# Patient Record
Sex: Female | Born: 1935 | Race: White | Hispanic: No | Smoking: Never smoker
Health system: Southern US, Community
[De-identification: ages and names within clinical notes are randomized; demographics above are authoritative.]

## PROBLEM LIST (undated history)

## (undated) DIAGNOSIS — E785 Hyperlipidemia, unspecified: Secondary | ICD-10-CM

## (undated) DIAGNOSIS — M1990 Unspecified osteoarthritis, unspecified site: Secondary | ICD-10-CM

## (undated) DIAGNOSIS — T7840XA Allergy, unspecified, initial encounter: Secondary | ICD-10-CM

## (undated) DIAGNOSIS — R32 Unspecified urinary incontinence: Secondary | ICD-10-CM

## (undated) DIAGNOSIS — I10 Essential (primary) hypertension: Secondary | ICD-10-CM

## (undated) DIAGNOSIS — C4491 Basal cell carcinoma of skin, unspecified: Secondary | ICD-10-CM

## (undated) DIAGNOSIS — M810 Age-related osteoporosis without current pathological fracture: Secondary | ICD-10-CM

## (undated) HISTORY — PX: BASAL CELL CARCINOMA EXCISION: SHX1214

## (undated) HISTORY — DX: Hyperlipidemia, unspecified: E78.5

## (undated) HISTORY — DX: Unspecified osteoarthritis, unspecified site: M19.90

## (undated) HISTORY — PX: CHOLECYSTECTOMY: SHX55

## (undated) HISTORY — DX: Allergy, unspecified, initial encounter: T78.40XA

## (undated) HISTORY — DX: Age-related osteoporosis without current pathological fracture: M81.0

## (undated) HISTORY — DX: Basal cell carcinoma of skin, unspecified: C44.91

## (undated) HISTORY — PX: OVARIAN CYST SURGERY: SHX726

## (undated) HISTORY — DX: Unspecified urinary incontinence: R32

---

## 1975-01-26 HISTORY — PX: BREAST SURGERY: SHX581

## 1999-01-26 LAB — HM COLONOSCOPY

## 2000-07-12 ENCOUNTER — Emergency Department (HOSPITAL_COMMUNITY): Admission: EM | Admit: 2000-07-12 | Discharge: 2000-07-12 | Payer: Self-pay | Admitting: Emergency Medicine

## 2000-10-12 ENCOUNTER — Other Ambulatory Visit: Admission: RE | Admit: 2000-10-12 | Discharge: 2000-10-12 | Payer: Self-pay | Admitting: Internal Medicine

## 2000-11-17 ENCOUNTER — Emergency Department (HOSPITAL_COMMUNITY): Admission: EM | Admit: 2000-11-17 | Discharge: 2000-11-17 | Payer: Self-pay | Admitting: Emergency Medicine

## 2000-11-17 ENCOUNTER — Encounter: Payer: Self-pay | Admitting: Emergency Medicine

## 2001-08-09 ENCOUNTER — Ambulatory Visit (HOSPITAL_COMMUNITY): Admission: RE | Admit: 2001-08-09 | Discharge: 2001-08-09 | Payer: Self-pay | Admitting: Gastroenterology

## 2001-12-07 ENCOUNTER — Encounter: Payer: Self-pay | Admitting: *Deleted

## 2001-12-07 ENCOUNTER — Emergency Department (HOSPITAL_COMMUNITY): Admission: EM | Admit: 2001-12-07 | Discharge: 2001-12-07 | Payer: Self-pay | Admitting: *Deleted

## 2002-11-15 ENCOUNTER — Encounter: Admission: RE | Admit: 2002-11-15 | Discharge: 2002-11-15 | Payer: Self-pay | Admitting: Internal Medicine

## 2002-11-15 ENCOUNTER — Encounter: Payer: Self-pay | Admitting: Internal Medicine

## 2003-10-31 ENCOUNTER — Other Ambulatory Visit: Admission: RE | Admit: 2003-10-31 | Discharge: 2003-10-31 | Payer: Self-pay | Admitting: Internal Medicine

## 2010-09-16 ENCOUNTER — Encounter (INDEPENDENT_AMBULATORY_CARE_PROVIDER_SITE_OTHER): Payer: Medicare Other | Admitting: Ophthalmology

## 2010-09-16 DIAGNOSIS — H3530 Unspecified macular degeneration: Secondary | ICD-10-CM

## 2010-09-16 DIAGNOSIS — H33309 Unspecified retinal break, unspecified eye: Secondary | ICD-10-CM

## 2010-09-16 DIAGNOSIS — H43819 Vitreous degeneration, unspecified eye: Secondary | ICD-10-CM

## 2010-09-30 ENCOUNTER — Encounter (INDEPENDENT_AMBULATORY_CARE_PROVIDER_SITE_OTHER): Payer: Medicare Other | Admitting: Ophthalmology

## 2010-09-30 DIAGNOSIS — H33309 Unspecified retinal break, unspecified eye: Secondary | ICD-10-CM

## 2010-10-09 ENCOUNTER — Encounter (INDEPENDENT_AMBULATORY_CARE_PROVIDER_SITE_OTHER): Payer: Medicare Other | Admitting: Ophthalmology

## 2010-10-09 DIAGNOSIS — H33309 Unspecified retinal break, unspecified eye: Secondary | ICD-10-CM

## 2010-12-09 ENCOUNTER — Other Ambulatory Visit: Payer: Self-pay

## 2010-12-09 ENCOUNTER — Encounter: Payer: Self-pay | Admitting: *Deleted

## 2010-12-09 ENCOUNTER — Emergency Department (HOSPITAL_COMMUNITY)
Admission: EM | Admit: 2010-12-09 | Discharge: 2010-12-09 | Disposition: A | Discharge status: Home / Self Care | Payer: Medicare Other | Attending: Emergency Medicine | Admitting: Emergency Medicine

## 2010-12-09 DIAGNOSIS — R42 Dizziness and giddiness: Secondary | ICD-10-CM | POA: Insufficient documentation

## 2010-12-09 DIAGNOSIS — R55 Syncope and collapse: Secondary | ICD-10-CM | POA: Insufficient documentation

## 2010-12-09 DIAGNOSIS — I10 Essential (primary) hypertension: Secondary | ICD-10-CM | POA: Insufficient documentation

## 2010-12-09 DIAGNOSIS — Z79899 Other long term (current) drug therapy: Secondary | ICD-10-CM | POA: Insufficient documentation

## 2010-12-09 HISTORY — DX: Essential (primary) hypertension: I10

## 2010-12-09 NOTE — ED Notes (Signed)
CBG 166 

## 2010-12-09 NOTE — ED Provider Notes (Addendum)
History     CSN: [id number] Arrival date & time: 12/09/2010 10:11 AM   First MD Initiated Contact with Patient 12/09/10 1027      Chief Complaint  Patient presents with  . Near Syncope    (Consider location/radiation/quality/duration/timing/severity/associated sxs/prior treatment) HPI This is a 77 year old pleasant lady with PMH of hypertension and a questionable hypothyroidism who presents to the ED with fainting episode. The history is provided by patient and her daughter. Patient states that she went to her orthopedic surgeons office today for right knee arthritis injection. She only drinks half a cup of juice for breakfast and took her atenolol 25 mg dose before the office visit. She started to feel fainting at 930am while waiting in the office. She was resting on the chair at the time. Denies vertigo, headache or seizure activities. No loss of consciousness. No weakness, numbness or tingling. No chest pain, chest pressure or palpitation. No nausea, vomiting or abdominal pain. Patient felt better and completely relieved after she was given some crackers and coke immediately. Patient was brought by EMS to [facility] ED for full evaluation.  Of note, pt takes atenolol 25 mg po QOD at night time. She took one dose this am since she missed her dose last night. Past Medical History  Diagnosis Date  . Hypertension     History reviewed. No pertinent past surgical history.  History reviewed. No pertinent family history.  History  Substance Use Topics  . Smoking status: Never Smoker   . Smokeless tobacco: Not on file  . Alcohol Use: No    OB History    Grav Para Term Preterm Abortions TAB SAB Ect Mult Living                  Review of Systems See HPI  Allergies  Penicillins; Sulfur; and Codeine  Home Medications   Current Outpatient Rx  Name Route Sig Dispense Refill  . ATENOLOL 25 MG PO TABS Oral Take 25 mg by mouth every other day.       BP 146/72  Pulse 57  Temp(Src)  96.8 F (36 C) (Oral)  Resp 18  SpO2 96%  Physical Exam General: alert, well-developed, and cooperative to examination.  Head: normocephalic and atraumatic.  Eyes: vision grossly intact, pupils equal, pupils round, pupils reactive to light, no injection and anicteric.  Mouth: pharynx pink and moist, no erythema, and no exudates.  Neck: supple, full ROM, no thyromegaly, no JVD, and no carotid bruits.  Lungs: normal respiratory effort, no accessory muscle use, normal breath sounds, no crackles, and no wheezes. Heart: normal rate, regular rhythm, no murmur, no gallop, and no rub.  Abdomen: soft, non-tender, normal bowel sounds, no distention, no guarding, no rebound tenderness, no hepatomegaly, and no splenomegaly.  Msk: no joint swelling, no joint warmth, and no redness over joints.  Pulses: 2+ DP/PT pulses bilaterally Extremities: No cyanosis, clubbing, edema Neurologic: alert & oriented X3, cranial nerves II-XII intact, strength normal in all extremities, sensation intact to light touch, and gait normal.  Skin: turgor normal and no rashes.  Psych: Oriented X3, memory intact for recent and remote, normally interactive, good eye contact, not anxious appearing, and not depressed appearing.   ED Course  Procedures (including critical care time)  1. lightheadness The clinical manifestation is consistent with lightheadness. The DDX include hypoglycemia, cardiac source and cerebral source. Pt did not eat her breakfast except for half cup of OJ this am. And her symptoms were relieved by food. Given  the fact that she did not have any cardiac symptoms or any sign of acute CVA/TIA, The likely etiology is hypoglycemia. Pt CBG and BP WNL now. - will perform EKG now. - will instruct her to follow up with her PCP in one week. - will D/C her home today if EKG is normal.      Dede Query, MD 12/09/10 1108  Dede Query, MD 12/09/10 1128

## 2010-12-09 NOTE — ED Provider Notes (Signed)
  I performed a history and physical examination of Summer Rodriguez and discussed her management with Dr. Dierdre Searles.  I agree with the history, physical, assessment, and plan of care, with the following exceptions: see below  Pt is asymptomatic, history is likely that her symptoms are related to taking her BP today rather than last night and had virtually empty stomach on top of that.  She ate some and felt improved.  No complete syncope, palpitations, CP, SOB, back pain.  She has no abd tenderness.    ECG shows sinus bradycardia, normal axis, intervals and no ST changes.   I was present for the following procedures: None Time Spent in Critical Care of the patient: None Time spent in discussions with the patient and family: 10 min  Darwin Rothlisberger Y.    Gavin Pound. Oletta Lamas, MD 12/09/10 1718

## 2010-12-09 NOTE — ED Notes (Signed)
Onset today at [facility] office for right knee possible injection.  Patient takes atenolol every other day at night and did not take medication last night.  Patient took medication prior to going to the Doctor's office.  While at [facility] office patient talking with Doctor and daughter at bedside patient felt like she was going to pass out.  Patient currently feels back to normal. Patient ax4 answering and following commands appropriate.  Airway intact bilateral equal chest rise and fall.

## 2010-12-09 NOTE — ED Notes (Signed)
Patient states she was at the orthopedic office about her right knee and states she took her atenolol this am and didn't eat of which she normally takes every other night  States she started getting dizzy and felt like she was going to faint. Presently states she feel her normal self. Daughter at bedside.

## 2010-12-10 LAB — GLUCOSE, CAPILLARY: Glucose-Capillary: 166 mg/dL — ABNORMAL HIGH (ref 70–99)

## 2011-02-08 ENCOUNTER — Ambulatory Visit (INDEPENDENT_AMBULATORY_CARE_PROVIDER_SITE_OTHER): Payer: Medicare Other | Admitting: Ophthalmology

## 2011-04-14 ENCOUNTER — Ambulatory Visit (INDEPENDENT_AMBULATORY_CARE_PROVIDER_SITE_OTHER): Payer: Medicare Other | Admitting: Ophthalmology

## 2011-04-16 ENCOUNTER — Ambulatory Visit (INDEPENDENT_AMBULATORY_CARE_PROVIDER_SITE_OTHER): Payer: Medicare Other | Admitting: Ophthalmology

## 2011-04-20 ENCOUNTER — Ambulatory Visit (INDEPENDENT_AMBULATORY_CARE_PROVIDER_SITE_OTHER): Payer: Medicare Other | Admitting: Ophthalmology

## 2011-04-20 DIAGNOSIS — H33309 Unspecified retinal break, unspecified eye: Secondary | ICD-10-CM

## 2011-04-20 DIAGNOSIS — D3130 Benign neoplasm of unspecified choroid: Secondary | ICD-10-CM

## 2011-04-20 DIAGNOSIS — H43819 Vitreous degeneration, unspecified eye: Secondary | ICD-10-CM | POA: Diagnosis not present

## 2011-04-20 DIAGNOSIS — H3530 Unspecified macular degeneration: Secondary | ICD-10-CM

## 2011-04-27 DIAGNOSIS — D3130 Benign neoplasm of unspecified choroid: Secondary | ICD-10-CM | POA: Diagnosis not present

## 2011-04-28 DIAGNOSIS — I10 Essential (primary) hypertension: Secondary | ICD-10-CM | POA: Diagnosis not present

## 2011-04-28 DIAGNOSIS — E782 Mixed hyperlipidemia: Secondary | ICD-10-CM | POA: Diagnosis not present

## 2011-04-28 DIAGNOSIS — E213 Hyperparathyroidism, unspecified: Secondary | ICD-10-CM | POA: Diagnosis not present

## 2011-04-28 DIAGNOSIS — Z0000 Encounter for general adult medical examination without abnormal findings: Secondary | ICD-10-CM | POA: Diagnosis not present

## 2011-04-28 DIAGNOSIS — E559 Vitamin D deficiency, unspecified: Secondary | ICD-10-CM | POA: Diagnosis not present

## 2011-05-04 DIAGNOSIS — M899 Disorder of bone, unspecified: Secondary | ICD-10-CM | POA: Diagnosis not present

## 2011-05-04 DIAGNOSIS — Z1231 Encounter for screening mammogram for malignant neoplasm of breast: Secondary | ICD-10-CM | POA: Diagnosis not present

## 2011-05-04 DIAGNOSIS — M949 Disorder of cartilage, unspecified: Secondary | ICD-10-CM | POA: Diagnosis not present

## 2011-05-11 DIAGNOSIS — Z1211 Encounter for screening for malignant neoplasm of colon: Secondary | ICD-10-CM | POA: Diagnosis not present

## 2011-11-23 DIAGNOSIS — L608 Other nail disorders: Secondary | ICD-10-CM | POA: Diagnosis not present

## 2011-11-23 DIAGNOSIS — I739 Peripheral vascular disease, unspecified: Secondary | ICD-10-CM | POA: Diagnosis not present

## 2011-11-23 DIAGNOSIS — B351 Tinea unguium: Secondary | ICD-10-CM | POA: Diagnosis not present

## 2011-11-23 DIAGNOSIS — M25579 Pain in unspecified ankle and joints of unspecified foot: Secondary | ICD-10-CM | POA: Diagnosis not present

## 2011-11-23 DIAGNOSIS — M79609 Pain in unspecified limb: Secondary | ICD-10-CM | POA: Diagnosis not present

## 2012-04-17 DIAGNOSIS — I739 Peripheral vascular disease, unspecified: Secondary | ICD-10-CM | POA: Diagnosis not present

## 2012-04-17 DIAGNOSIS — L608 Other nail disorders: Secondary | ICD-10-CM | POA: Diagnosis not present

## 2012-04-19 ENCOUNTER — Ambulatory Visit (INDEPENDENT_AMBULATORY_CARE_PROVIDER_SITE_OTHER): Payer: Medicare Other | Admitting: Ophthalmology

## 2012-04-24 ENCOUNTER — Ambulatory Visit (INDEPENDENT_AMBULATORY_CARE_PROVIDER_SITE_OTHER): Payer: Medicare Other | Admitting: Ophthalmology

## 2012-04-24 DIAGNOSIS — D3130 Benign neoplasm of unspecified choroid: Secondary | ICD-10-CM

## 2012-04-24 DIAGNOSIS — I10 Essential (primary) hypertension: Secondary | ICD-10-CM

## 2012-04-24 DIAGNOSIS — H43819 Vitreous degeneration, unspecified eye: Secondary | ICD-10-CM

## 2012-04-24 DIAGNOSIS — H3530 Unspecified macular degeneration: Secondary | ICD-10-CM | POA: Diagnosis not present

## 2012-04-24 DIAGNOSIS — H3560 Retinal hemorrhage, unspecified eye: Secondary | ICD-10-CM | POA: Diagnosis not present

## 2012-04-24 DIAGNOSIS — H35039 Hypertensive retinopathy, unspecified eye: Secondary | ICD-10-CM | POA: Diagnosis not present

## 2012-04-24 DIAGNOSIS — H33309 Unspecified retinal break, unspecified eye: Secondary | ICD-10-CM

## 2012-05-08 DIAGNOSIS — Z1231 Encounter for screening mammogram for malignant neoplasm of breast: Secondary | ICD-10-CM | POA: Diagnosis not present

## 2012-05-30 ENCOUNTER — Ambulatory Visit (INDEPENDENT_AMBULATORY_CARE_PROVIDER_SITE_OTHER): Payer: Medicare Other | Admitting: Gynecology

## 2012-05-30 ENCOUNTER — Encounter: Payer: Self-pay | Admitting: Gynecology

## 2012-05-30 ENCOUNTER — Other Ambulatory Visit (HOSPITAL_COMMUNITY)
Admission: RE | Admit: 2012-05-30 | Discharge: 2012-05-30 | Disposition: A | Discharge status: Home / Self Care | Payer: Medicare Other | Source: Ambulatory Visit | Attending: Gynecology | Admitting: Gynecology

## 2012-05-30 VITALS — BP 130/80 | Ht 66.0 in | Wt 150.0 lb

## 2012-05-30 DIAGNOSIS — H3560 Retinal hemorrhage, unspecified eye: Secondary | ICD-10-CM | POA: Diagnosis not present

## 2012-05-30 DIAGNOSIS — Z124 Encounter for screening for malignant neoplasm of cervix: Secondary | ICD-10-CM | POA: Insufficient documentation

## 2012-05-30 DIAGNOSIS — M899 Disorder of bone, unspecified: Secondary | ICD-10-CM | POA: Diagnosis not present

## 2012-05-30 DIAGNOSIS — R32 Unspecified urinary incontinence: Secondary | ICD-10-CM | POA: Diagnosis not present

## 2012-05-30 DIAGNOSIS — M8580 Other specified disorders of bone density and structure, unspecified site: Secondary | ICD-10-CM

## 2012-05-30 DIAGNOSIS — M949 Disorder of cartilage, unspecified: Secondary | ICD-10-CM

## 2012-05-30 DIAGNOSIS — N952 Postmenopausal atrophic vaginitis: Secondary | ICD-10-CM | POA: Diagnosis not present

## 2012-05-30 NOTE — Progress Notes (Signed)
Summer Rodriguez 11/09/1921 [id number]        79 y.o.  [id number] new patient with several issues noted below.   Past medical history,surgical history, medications, allergies, family history and social history were all reviewed and documented in the EPIC chart. ROS:  Was performed and pertinent positives and negatives are included in the history.  Exam: Kim assistant Filed Vitals:   05/30/12 1610  BP: 130/80  Height: 5\' 6"  (1.676 m)  Weight: 150 lb (68.04 kg)   General appearance  Normal Skin grossly normal Head/Neck normal with no cervical or supraclavicular adenopathy thyroid normal Lungs  clear Cardiac RR, without RMG Abdominal  soft, nontender, without masses, organomegaly or hernia Breasts  examined lying and sitting without masses, retractions, discharge or axillary adenopathy. Bilateral implants noted. Pelvic  Ext/BUS/vagina  normal age appropriate with atrophic changes  Cervix  normal flush with the upper vagina  Uterus  difficult to palpate but grossly normal   Adnexa  Without masses or tenderness    Anus and perineum  normal   Rectovaginal  normal sphincter tone without palpated masses or tenderness.    Assessment/Plan:  79 y.o. [id number] female    1. Atrophic genital changes. Patient asymptomatic and we'll continue to monitor. 2. Urinary incontinence. Patient has some leakage of urine difficult to separate stress from urge. Not overly bothersome to the patient who reports this as an occasional episode. We'll plan expectant management at this time. To consider more thorough evaluation and/or consideration for OAB medication if symptoms worsen. 3. Constipation/lower rectal discomfort. Had asked for GI referral from her primary and colonoscopy and apparently was told she exceeded the age limit. Given her symptoms I think she should be evaluated and will refer to GI. Whether they perform colonoscopy or not will let them decide. 4. Osteopenia. Had DEXA 2013 historically at  [place]. Do not have a copy of this and will retrieve it. Was told that it was stable in no treatment needed. 5. Mammography 2014 at [place]. Will retrieve copy of this. Does have partial bilateral mastectomies for fibrocystic disease years ago with implants. We'll continue to follow with [facility]. 6. Pap smear 12 years ago. Patient very concerned about lack of Pap smear followup. I reviewed the current screening guidelines and ultimately performed a Pap smear for her reassurance. 7. Health maintenance. No blood work done as this will be done through her primary physician's office. Followup in one year, sooner as needed.    Dara Lords MD, 5:06 PM 05/30/2012

## 2012-05-30 NOTE — Patient Instructions (Signed)
Office will call you to arrange GI consult.

## 2012-05-31 ENCOUNTER — Telehealth: Payer: Self-pay | Admitting: *Deleted

## 2012-05-31 ENCOUNTER — Encounter: Payer: Self-pay | Admitting: Gastroenterology

## 2012-05-31 DIAGNOSIS — K6289 Other specified diseases of anus and rectum: Secondary | ICD-10-CM

## 2012-05-31 NOTE — Telephone Encounter (Signed)
Pt informed with the below note. 

## 2012-05-31 NOTE — Telephone Encounter (Signed)
Message copied by Aura Camps on Wed May 31, 2012  9:16 AM ------      Message from: Dara Lords      Created: Tue May 30, 2012  5:14 PM       Patient needs GI referral reference lower abdominal discomfort and rectal discomfort. ------

## 2012-05-31 NOTE — Telephone Encounter (Signed)
Referral order placed appt. On 06/22/12 @ 3:00pm pt asked me to give information to her daughter Luster Landsberg at [phone] I left message for pt to call.

## 2012-06-02 ENCOUNTER — Encounter (INDEPENDENT_AMBULATORY_CARE_PROVIDER_SITE_OTHER): Payer: Medicare Other | Admitting: Ophthalmology

## 2012-06-02 DIAGNOSIS — H3560 Retinal hemorrhage, unspecified eye: Secondary | ICD-10-CM

## 2012-06-02 DIAGNOSIS — D3130 Benign neoplasm of unspecified choroid: Secondary | ICD-10-CM | POA: Diagnosis not present

## 2012-06-02 DIAGNOSIS — I10 Essential (primary) hypertension: Secondary | ICD-10-CM

## 2012-06-02 DIAGNOSIS — H3530 Unspecified macular degeneration: Secondary | ICD-10-CM

## 2012-06-02 DIAGNOSIS — H35039 Hypertensive retinopathy, unspecified eye: Secondary | ICD-10-CM

## 2012-06-02 DIAGNOSIS — H43819 Vitreous degeneration, unspecified eye: Secondary | ICD-10-CM

## 2012-06-20 ENCOUNTER — Other Ambulatory Visit (INDEPENDENT_AMBULATORY_CARE_PROVIDER_SITE_OTHER): Payer: Medicare Other

## 2012-06-20 ENCOUNTER — Ambulatory Visit (INDEPENDENT_AMBULATORY_CARE_PROVIDER_SITE_OTHER): Payer: Medicare Other | Admitting: Internal Medicine

## 2012-06-20 ENCOUNTER — Encounter: Payer: Self-pay | Admitting: Internal Medicine

## 2012-06-20 VITALS — BP 136/72 | HR 55 | Temp 97.6°F | Ht 67.0 in | Wt 151.2 lb

## 2012-06-20 DIAGNOSIS — E0590 Thyrotoxicosis, unspecified without thyrotoxic crisis or storm: Secondary | ICD-10-CM | POA: Insufficient documentation

## 2012-06-20 DIAGNOSIS — I10 Essential (primary) hypertension: Secondary | ICD-10-CM | POA: Diagnosis not present

## 2012-06-20 DIAGNOSIS — L989 Disorder of the skin and subcutaneous tissue, unspecified: Secondary | ICD-10-CM | POA: Diagnosis not present

## 2012-06-20 DIAGNOSIS — H3530 Unspecified macular degeneration: Secondary | ICD-10-CM | POA: Diagnosis not present

## 2012-06-20 LAB — CBC
Hemoglobin: 14 g/dL (ref 12.0–15.0)
MCHC: 33.6 g/dL (ref 30.0–36.0)
MCV: 88.5 fl (ref 78.0–100.0)
Platelets: 124 10*3/uL — ABNORMAL LOW (ref 150.0–400.0)
RDW: 13.7 % (ref 11.5–14.6)

## 2012-06-20 LAB — T3: T3, Total: 107.9 ng/dL (ref 80.0–204.0)

## 2012-06-20 LAB — HEPATIC FUNCTION PANEL
AST: 18 U/L (ref 0–37)
Albumin: 3.7 g/dL (ref 3.5–5.2)
Alkaline Phosphatase: 59 U/L (ref 39–117)
Bilirubin, Direct: 0.1 mg/dL (ref 0.0–0.3)
Total Bilirubin: 0.7 mg/dL (ref 0.3–1.2)

## 2012-06-20 LAB — LIPID PANEL
Cholesterol: 184 mg/dL (ref 0–200)
Triglycerides: 85 mg/dL (ref 0.0–149.0)

## 2012-06-20 LAB — BASIC METABOLIC PANEL
BUN: 17 mg/dL (ref 6–23)
Calcium: 10.9 mg/dL — ABNORMAL HIGH (ref 8.4–10.5)
Creatinine, Ser: 1 mg/dL (ref 0.4–1.2)
GFR: 53.1 mL/min — ABNORMAL LOW (ref >60.00)

## 2012-06-20 LAB — T4, FREE: Free T4: 0.95 ng/dL (ref 0.60–1.60)

## 2012-06-20 MED ORDER — AMLODIPINE BESYLATE 5 MG PO TABS: 5.0000 mg | ORAL_TABLET | Freq: Every day | ORAL | Status: DC

## 2012-06-20 NOTE — Patient Instructions (Signed)

## 2012-06-20 NOTE — Progress Notes (Signed)
HPI  Pt presents to the clinic today to establish care. She is transferring care from Dr. Donette Larry at [facility]. She does have some concerns today. She is on atenolol for HBP and macular degeneration. She is having unwanted side effects from the atenolol and would like to be switched to something else. She has had extreme constipation since starting the atenolol and is unable to tolerate it anymore. Also, she c/o suspicious skin lesions all over her body. She has wanted a referral to a dermatologist but Dr. Donette Larry would never refer her. She has no history of skin cancer but these new lesions that are popping up are very concerning to her. She also has a history of untreated hyperthyroidism, she needs her levels checked today.  Flu: 2012 Tetanus: 2009 Pneumovax: 2012 Eye doctor: yearly Dentist: yearly Colonoscopy: 2001 (has appt Thursday) Pap :2014 Mammogram: 2014  Past Medical History  Diagnosis Date  . Hypertension   . Allergy   . Arthritis   . Urine incontinence     Current Outpatient Prescriptions  Medication Sig Dispense Refill  . atenolol (TENORMIN) 25 MG tablet Take 25 mg by mouth every other day.       . beta carotene w/minerals (OCUVITE) tablet Take 1 tablet by mouth daily.      . Cholecalciferol (D3-1000) 1000 UNITS capsule Take 1,000 Units by mouth 2 (two) times daily.      . Glycerin-Hypromellose-PEG 400 (HM DRY EYE RELIEF) 0.2-0.2-1 % SOLN Apply to eye as needed.      . Multiple Minerals-Vitamins (CALCIUM-MAGNESIUM-ZINC) TABS Take 1 tablet by mouth daily.      . naproxen sodium (ALEVE) 220 MG tablet Take 220 mg by mouth as needed.       No current facility-administered medications for this visit.    Allergies  Allergen Reactions  . Penicillins Hives and Shortness Of Breath  . Sulfur Hives and Shortness Of Breath  . Codeine Other (See Comments)    "Spaced out" / "Altered reality"    Family History  Problem Relation Age of Onset  . Ovarian cancer Mother   .  Cancer Mother     Ovarian/Uterine Cancer  . Diabetes Neg Hx   . Stroke Neg Hx     History   Social History  . Marital Status: Widowed    Spouse Name: N/A    Number of Children: 2  . Years of Education: 14   Occupational History  . Retired    Social History Main Topics  . Smoking status: Never Smoker   . Smokeless tobacco: Never Used  . Alcohol Use: No     Comment: Rare  . Drug Use: No  . Sexually Active: No   Other Topics Concern  . Not on file   Social History Narrative   Regular exercise-no   Caffeine Use-yes    ROS:  Constitutional: Denies fever, malaise, fatigue, headache or abrupt weight changes.  HEENT: Denies eye pain, eye redness, ear pain, ringing in the ears, wax buildup, runny nose, nasal congestion, bloody nose, or sore throat. Respiratory: Denies difficulty breathing, shortness of breath, cough or sputum production.   Cardiovascular: Denies chest pain, chest tightness, palpitations or swelling in the hands or feet.  Gastrointestinal: Pt reports constipation. Denies abdominal pain, bloating, diarrhea or blood in the stool.  GU: Pt reports urinary incontinence. Denies frequency, urgency, pain with urination, blood in urine, odor or discharge. Musculoskeletal: Pt reports bilateral knee pain. Denies decrease in range of motion, difficulty with gait,  muscle pain or joint swelling.  Skin: Pt reports new generalized lesions. Denies redness, rashes,  or ulcercations.  Neurological: Denies dizziness, difficulty with memory, difficulty with speech or problems with balance and coordination.   No other specific complaints in a complete review of systems (except as listed in HPI above).  PE:  BP 136/72  Pulse 55  Temp(Src) 97.6 F (36.4 C) (Oral)  Ht 5\' 7"  (1.702 m)  Wt 151 lb 4 oz (68.607 kg)  BMI 23.68 kg/m2  SpO2 96% Wt Readings from Last 3 Encounters:  06/20/12 151 lb 4 oz (68.607 kg)  05/30/12 150 lb (68.04 kg)    General: Appears her stated age,  well developed, well nourished in NAD. Skin: Multiple skin lesions that appear to need biopsy by specialist noted on abdomen and back. HEENT: Head: normal shape and size; Eyes: sclera white, no icterus, conjunctiva pink, PERRLA and EOMs intact; Ears: Tm's gray and intact, normal light reflex; Nose: mucosa pink and moist, septum midline; Throat/Mouth: Teeth present, mucosa pink and moist, no lesions or ulcerations noted.  Neck: Normal range of motion. Neck supple, trachea midline. No massses, lumps or thyromegaly present.  Cardiovascular: Normal rate and rhythm. S1,S2 noted.  No murmur, rubs or gallops noted. No JVD or BLE edema. No carotid bruits noted. Pulmonary/Chest: Normal effort and positive vesicular breath sounds. No respiratory distress. No wheezes, rales or ronchi noted.  Abdomen: Soft and nontender. Normal bowel sounds, no bruits noted. No distention or masses noted. Liver, spleen and kidneys non palpable. Musculoskeletal: Normal range of motion. No signs of joint swelling. No difficulty with gait.  Neurological: Alert and oriented. Cranial nerves II-XII intact. Coordination normal. +DTRs bilaterally. Psychiatric: Mood and affect normal. Behavior is normal. Judgment and thought content normal.      Assessment and Plan:

## 2012-06-20 NOTE — Assessment & Plan Note (Signed)
Well controlled Will change atenolol to Norvasc

## 2012-06-20 NOTE — Assessment & Plan Note (Signed)
Will refer to dermatology for further evaluation and biopsy

## 2012-06-20 NOTE — Assessment & Plan Note (Signed)
Will check thyroid studies today If needed will refer to endocrinology versus thyroid scan

## 2012-06-22 ENCOUNTER — Ambulatory Visit (INDEPENDENT_AMBULATORY_CARE_PROVIDER_SITE_OTHER): Payer: Medicare Other | Admitting: Gastroenterology

## 2012-06-22 ENCOUNTER — Encounter: Payer: Self-pay | Admitting: Internal Medicine

## 2012-06-22 ENCOUNTER — Encounter: Payer: Self-pay | Admitting: Gastroenterology

## 2012-06-22 VITALS — BP 164/96 | HR 64 | Ht 66.0 in | Wt 149.8 lb

## 2012-06-22 DIAGNOSIS — I10 Essential (primary) hypertension: Secondary | ICD-10-CM | POA: Diagnosis not present

## 2012-06-22 DIAGNOSIS — K5900 Constipation, unspecified: Secondary | ICD-10-CM | POA: Insufficient documentation

## 2012-06-22 NOTE — Progress Notes (Signed)
History of Present Illness:  Pleasant 79 year old white female referred for evaluation of constipation. This is been a long-standing problem for at least 10 years which she has attributed to atenolol. She may go several days without a bowel movement and then developed abdominal discomfort and rectal pain. She denies rectal bleeding. Last colonoscopy was in 2001. Antihypertensives were changed 2 days ago.     Past Medical History  Diagnosis Date  . Hypertension   . Allergy   . Arthritis   . Urine incontinence   . Hyperlipemia   . Arthritis    Past Surgical History  Procedure Laterality Date  . Cholecystectomy    . Ovarian cyst surgery    . Breast surgery  1977    Bilateral partial mastectomy   family history includes Cancer in her mother and Ovarian cancer in her mother.  There is no history of Diabetes and Stroke. Current Outpatient Prescriptions  Medication Sig Dispense Refill  . amLODipine (NORVASC) 5 MG tablet Take 1 tablet (5 mg total) by mouth daily.  30 tablet  3  . beta carotene w/minerals (OCUVITE) tablet Take 1 tablet by mouth daily.      . Cholecalciferol (D3-1000) 1000 UNITS capsule Take 1,000 Units by mouth 2 (two) times daily.      . Glycerin-Hypromellose-PEG 400 (HM DRY EYE RELIEF) 0.2-0.2-1 % SOLN Apply to eye as needed.      . Multiple Minerals-Vitamins (CALCIUM-MAGNESIUM-ZINC) TABS Take 1 tablet by mouth daily.      . naproxen sodium (ALEVE) 220 MG tablet Take 220 mg by mouth as needed.       No current facility-administered medications for this visit.   Allergies as of 06/22/2012 - Review Complete 06/22/2012  Allergen Reaction Noted  . Penicillins Hives and Shortness Of Breath 12/09/2010  . Sulfur Hives and Shortness Of Breath 12/09/2010  . Codeine Other (See Comments) 12/09/2010    reports that she has never smoked. She has never used smokeless tobacco. She reports that she does not drink alcohol or use illicit drugs.     Review of Systems: Pertinent  positive and negative review of systems were noted in the above HPI section. All other review of systems were otherwise negative.  Vital signs were reviewed in today's medical record Physical Exam: General: Well developed , well nourished, no acute distress Skin: anicteric Head: Normocephalic and atraumatic Eyes:  sclerae anicteric, EOMI Ears: Normal auditory acuity Mouth: No deformity or lesions Neck: Supple, no masses or thyromegaly Lungs: Clear throughout to auscultation Heart: Regular rate and rhythm; no murmurs, rubs or bruits Abdomen: Soft, non tender and non distended. No masses, hepatosplenomegaly or hernias noted. Normal Bowel sounds Rectal: There are no rectal masses. Stool is Hemoccult negative Musculoskeletal: Symmetrical with no gross deformities  Skin: No lesions on visible extremities Pulses:  Normal pulses noted Extremities: No clubbing, cyanosis, edema or deformities noted Neurological: Alert oriented x 4, grossly nonfocal Cervical Nodes:  No significant cervical adenopathy Inguinal Nodes: No significant inguinal adenopathy Psychological:  Alert and cooperative. Normal mood and affect          Past Medical History  Diagnosis Date  . Hypertension   . Allergy   . Arthritis   . Urine incontinence   . Hyperlipemia   . Arthritis    Past Surgical History  Procedure Laterality Date  . Cholecystectomy    . Ovarian cyst surgery    . Breast surgery  1977    Bilateral partial mastectomy   family history  includes Cancer in her mother and Ovarian cancer in her mother.  There is no history of Diabetes and Stroke. Current Outpatient Prescriptions  Medication Sig Dispense Refill  . amLODipine (NORVASC) 5 MG tablet Take 1 tablet (5 mg total) by mouth daily.  30 tablet  3  . beta carotene w/minerals (OCUVITE) tablet Take 1 tablet by mouth daily.      . Cholecalciferol (D3-1000) 1000 UNITS capsule Take 1,000 Units by mouth 2 (two) times daily.      .  Glycerin-Hypromellose-PEG 400 (HM DRY EYE RELIEF) 0.2-0.2-1 % SOLN Apply to eye as needed.      . Multiple Minerals-Vitamins (CALCIUM-MAGNESIUM-ZINC) TABS Take 1 tablet by mouth daily.      . naproxen sodium (ALEVE) 220 MG tablet Take 220 mg by mouth as needed.       No current facility-administered medications for this visit.   Allergies as of 06/22/2012 - Review Complete 06/22/2012  Allergen Reaction Noted  . Penicillins Hives and Shortness Of Breath 12/09/2010  . Sulfur Hives and Shortness Of Breath 12/09/2010  . Codeine Other (See Comments) 12/09/2010    reports that she has never smoked. She has never used smokeless tobacco. She reports that she does not drink alcohol or use illicit drugs.     Review of Systems: Pertinent positive and negative review of systems were noted in the above HPI section. All other review of systems were otherwise negative.  Vital signs were reviewed in today's medical record Physical Exam: General: Well developed , well nourished, no acute distress Skin: anicteric Head: Normocephalic and atraumatic Eyes:  sclerae anicteric, EOMI Ears: Normal auditory acuity Mouth: No deformity or lesions Neck: Supple, no masses or thyromegaly Lungs: Clear throughout to auscultation Heart: Regular rate and rhythm; no murmurs, rubs or bruits Abdomen: Soft, non tender and non distended. No masses, hepatosplenomegaly or hernias noted. Normal Bowel sounds Rectal:deferred Musculoskeletal: Symmetrical with no gross deformities  Skin: No lesions on visible extremities Pulses:  Normal pulses noted Extremities: No clubbing, cyanosis, edema or deformities noted Neurological: Alert oriented x 4, grossly nonfocal Cervical Nodes:  No significant cervical adenopathy Inguinal Nodes: No significant inguinal adenopathy Psychological:  Alert and cooperative. Normal mood and affect

## 2012-06-22 NOTE — Assessment & Plan Note (Signed)
Pressure currently is high on Norvasc. Primary care is aware.

## 2012-06-22 NOTE — Assessment & Plan Note (Signed)
The patient most likely has functional constipation. There are no ominous signs including rectal bleeding or Hemoccult-positive stool. Symptoms may also be medication-related.  Recommendations #1 reevaluate after several days off atenolol; if not improved she will start Linzess 145 mcg daily and reports her progress in 7 days

## 2012-06-22 NOTE — Patient Instructions (Addendum)
Follow up in 3 weeks We are giving you Linzess samples to take once daily in the morning 30 minutes before breakfast  Call one week after starting Linzess

## 2012-07-03 ENCOUNTER — Encounter (INDEPENDENT_AMBULATORY_CARE_PROVIDER_SITE_OTHER): Payer: Medicare Other | Admitting: Ophthalmology

## 2012-07-03 DIAGNOSIS — I10 Essential (primary) hypertension: Secondary | ICD-10-CM | POA: Diagnosis not present

## 2012-07-03 DIAGNOSIS — H35039 Hypertensive retinopathy, unspecified eye: Secondary | ICD-10-CM

## 2012-07-03 DIAGNOSIS — H3509 Other intraretinal microvascular abnormalities: Secondary | ICD-10-CM

## 2012-07-03 DIAGNOSIS — H3530 Unspecified macular degeneration: Secondary | ICD-10-CM

## 2012-07-03 DIAGNOSIS — D3130 Benign neoplasm of unspecified choroid: Secondary | ICD-10-CM

## 2012-07-03 DIAGNOSIS — H3560 Retinal hemorrhage, unspecified eye: Secondary | ICD-10-CM | POA: Diagnosis not present

## 2012-07-10 ENCOUNTER — Telehealth: Payer: Self-pay | Admitting: Gastroenterology

## 2012-07-10 DIAGNOSIS — I739 Peripheral vascular disease, unspecified: Secondary | ICD-10-CM | POA: Diagnosis not present

## 2012-07-10 DIAGNOSIS — L608 Other nail disorders: Secondary | ICD-10-CM | POA: Diagnosis not present

## 2012-07-10 MED ORDER — LINACLOTIDE 145 MCG PO CAPS: 145.0000 ug | ORAL_CAPSULE | Freq: Every day | ORAL | Status: DC

## 2012-07-10 NOTE — Telephone Encounter (Signed)
Informed pt that Linzess was sent to her pharmacy and if Linzess is helping to continue and to call back as neeed

## 2012-07-10 NOTE — Telephone Encounter (Signed)
Dr Kaplan. Please advise 

## 2012-07-10 NOTE — Telephone Encounter (Signed)
Prescribe Linzess 145ug qd

## 2012-07-16 ENCOUNTER — Encounter: Payer: Self-pay | Admitting: Gastroenterology

## 2012-07-17 ENCOUNTER — Other Ambulatory Visit: Payer: Self-pay | Admitting: Gastroenterology

## 2012-07-17 MED ORDER — LUBIPROSTONE 24 MCG PO CAPS: 24.0000 ug | ORAL_CAPSULE | Freq: Two times a day (BID) | ORAL | Status: DC

## 2012-07-24 DIAGNOSIS — D239 Other benign neoplasm of skin, unspecified: Secondary | ICD-10-CM | POA: Diagnosis not present

## 2012-07-24 DIAGNOSIS — L821 Other seborrheic keratosis: Secondary | ICD-10-CM | POA: Diagnosis not present

## 2012-07-24 DIAGNOSIS — L270 Generalized skin eruption due to drugs and medicaments taken internally: Secondary | ICD-10-CM | POA: Diagnosis not present

## 2012-08-15 DIAGNOSIS — M545 Low back pain, unspecified: Secondary | ICD-10-CM | POA: Diagnosis not present

## 2012-09-18 ENCOUNTER — Other Ambulatory Visit: Payer: Self-pay | Admitting: Internal Medicine

## 2012-09-26 ENCOUNTER — Other Ambulatory Visit: Payer: Self-pay | Admitting: Internal Medicine

## 2012-09-27 MED ORDER — AMLODIPINE BESYLATE 5 MG PO TABS: ORAL_TABLET | ORAL | Status: DC

## 2012-09-27 NOTE — Addendum Note (Signed)
Addended by: Rock Nephew T on: 09/27/2012 09:45 AM   Modules accepted: Orders

## 2012-09-28 DIAGNOSIS — I739 Peripheral vascular disease, unspecified: Secondary | ICD-10-CM | POA: Diagnosis not present

## 2012-09-28 DIAGNOSIS — L608 Other nail disorders: Secondary | ICD-10-CM | POA: Diagnosis not present

## 2012-10-03 ENCOUNTER — Encounter (INDEPENDENT_AMBULATORY_CARE_PROVIDER_SITE_OTHER): Payer: Medicare Other | Admitting: Ophthalmology

## 2012-10-03 DIAGNOSIS — H43819 Vitreous degeneration, unspecified eye: Secondary | ICD-10-CM

## 2012-10-03 DIAGNOSIS — H3530 Unspecified macular degeneration: Secondary | ICD-10-CM | POA: Diagnosis not present

## 2012-10-03 DIAGNOSIS — I10 Essential (primary) hypertension: Secondary | ICD-10-CM | POA: Diagnosis not present

## 2012-10-03 DIAGNOSIS — H3560 Retinal hemorrhage, unspecified eye: Secondary | ICD-10-CM

## 2012-10-03 DIAGNOSIS — H35039 Hypertensive retinopathy, unspecified eye: Secondary | ICD-10-CM

## 2012-10-03 DIAGNOSIS — H33309 Unspecified retinal break, unspecified eye: Secondary | ICD-10-CM

## 2012-10-19 DIAGNOSIS — Z23 Encounter for immunization: Secondary | ICD-10-CM | POA: Diagnosis not present

## 2012-11-08 DIAGNOSIS — Z0000 Encounter for general adult medical examination without abnormal findings: Secondary | ICD-10-CM | POA: Diagnosis not present

## 2012-11-30 ENCOUNTER — Other Ambulatory Visit: Payer: Self-pay

## 2012-12-27 DIAGNOSIS — L821 Other seborrheic keratosis: Secondary | ICD-10-CM | POA: Diagnosis not present

## 2012-12-27 DIAGNOSIS — I781 Nevus, non-neoplastic: Secondary | ICD-10-CM | POA: Diagnosis not present

## 2012-12-27 DIAGNOSIS — D485 Neoplasm of uncertain behavior of skin: Secondary | ICD-10-CM | POA: Diagnosis not present

## 2012-12-27 DIAGNOSIS — L570 Actinic keratosis: Secondary | ICD-10-CM | POA: Diagnosis not present

## 2012-12-27 DIAGNOSIS — C44319 Basal cell carcinoma of skin of other parts of face: Secondary | ICD-10-CM | POA: Diagnosis not present

## 2012-12-27 DIAGNOSIS — D0460 Carcinoma in situ of skin of unspecified upper limb, including shoulder: Secondary | ICD-10-CM | POA: Diagnosis not present

## 2012-12-27 DIAGNOSIS — D239 Other benign neoplasm of skin, unspecified: Secondary | ICD-10-CM | POA: Diagnosis not present

## 2012-12-27 DIAGNOSIS — L820 Inflamed seborrheic keratosis: Secondary | ICD-10-CM | POA: Diagnosis not present

## 2013-01-01 DIAGNOSIS — R1310 Dysphagia, unspecified: Secondary | ICD-10-CM | POA: Diagnosis not present

## 2013-01-01 DIAGNOSIS — R220 Localized swelling, mass and lump, head: Secondary | ICD-10-CM | POA: Diagnosis not present

## 2013-02-05 ENCOUNTER — Ambulatory Visit (INDEPENDENT_AMBULATORY_CARE_PROVIDER_SITE_OTHER): Payer: Medicare Other | Admitting: Gastroenterology

## 2013-02-05 ENCOUNTER — Other Ambulatory Visit (INDEPENDENT_AMBULATORY_CARE_PROVIDER_SITE_OTHER): Payer: Medicare Other

## 2013-02-05 ENCOUNTER — Encounter: Payer: Self-pay | Admitting: Gastroenterology

## 2013-02-05 VITALS — BP 116/76 | HR 96 | Ht 65.25 in | Wt 153.1 lb

## 2013-02-05 DIAGNOSIS — K921 Melena: Secondary | ICD-10-CM

## 2013-02-05 DIAGNOSIS — K5900 Constipation, unspecified: Secondary | ICD-10-CM

## 2013-02-05 LAB — CBC WITH DIFFERENTIAL/PLATELET
BASOS PCT: 1 % (ref 0.0–3.0)
Basophils Absolute: 0.1 10*3/uL (ref 0.0–0.1)
EOS ABS: 0.1 10*3/uL (ref 0.0–0.7)
EOS PCT: 1.6 % (ref 0.0–5.0)
HCT: 39.5 % (ref 36.0–46.0)
Hemoglobin: 13.4 g/dL (ref 12.0–15.0)
LYMPHS PCT: 28.8 % (ref 12.0–46.0)
Lymphs Abs: 2.1 10*3/uL (ref 0.7–4.0)
MCHC: 33.9 g/dL (ref 30.0–36.0)
MCV: 87.5 fl (ref 78.0–100.0)
Monocytes Absolute: 0.4 10*3/uL (ref 0.1–1.0)
Monocytes Relative: 5.7 % (ref 3.0–12.0)
NEUTROS PCT: 62.9 % (ref 43.0–77.0)
Neutro Abs: 4.5 10*3/uL (ref 1.4–7.7)
PLATELETS: 148 10*3/uL — AB (ref 150.0–400.0)
RBC: 4.51 Mil/uL (ref 3.87–5.11)
RDW: 13 % (ref 11.5–14.6)
WBC: 7.1 10*3/uL (ref 4.5–10.5)

## 2013-02-05 MED ORDER — NA SULFATE-K SULFATE-MG SULF 17.5-3.13-1.6 GM/177ML PO SOLN: 1.0000 | Freq: Once | ORAL | Status: DC

## 2013-02-05 NOTE — Addendum Note (Signed)
Addended by: Oda Kilts on: 02/05/2013 09:24 AM   Modules accepted: Orders

## 2013-02-05 NOTE — Addendum Note (Signed)
Addended by: Oda Kilts on: 02/05/2013 09:16 AM   Modules accepted: Orders

## 2013-02-05 NOTE — Assessment & Plan Note (Signed)
Persistence of constipation and now black stools raises my suspicion for a structural abnormality the colon with bleeding.  Recommendations #1 CBC #2 colonoscopy

## 2013-02-05 NOTE — Patient Instructions (Signed)

## 2013-02-05 NOTE — Progress Notes (Signed)
          History of Present Illness:  Patient has returned for reevaluation of constipation.  She may go 4-5 days without a bowel movement at which point she'll take a laxative.  She denies abdominal pain.  She claims stools are black.  She took Linzess very briefly but stopped this after she noticed stools turned black.    Review of Systems: Pertinent positive and negative review of systems were noted in the above HPI section. All other review of systems were otherwise negative.    Current Medications, Allergies, Past Medical History, Past Surgical History, Family History and Social History were reviewed in [facility] record  Vital signs were reviewed in today's medical record. Physical Exam: General: Well developed , well nourished, no acute distress Skin: anicteric Head: Normocephalic and atraumatic Eyes:  sclerae anicteric, EOMI Ears: Normal auditory acuity Mouth: No deformity or lesions Lungs: Clear throughout to auscultation Heart: Regular rate and rhythm; no murmurs, rubs or bruits Abdomen: Soft, non tender and non distended. No masses, hepatosplenomegaly or hernias noted. Normal Bowel sounds Rectal:deferred Musculoskeletal: Symmetrical with no gross deformities  Pulses:  Normal pulses noted Extremities: No clubbing, cyanosis, edema or deformities noted Neurological: Alert oriented x 4, grossly nonfocal Psychological:  Alert and cooperative. Normal mood and affect  See Assessment and Plan under Problem List

## 2013-02-20 ENCOUNTER — Ambulatory Visit (AMBULATORY_SURGERY_CENTER): Payer: Medicare Other | Admitting: Gastroenterology

## 2013-02-20 ENCOUNTER — Encounter: Payer: Self-pay | Admitting: Gastroenterology

## 2013-02-20 VITALS — BP 145/78 | HR 80 | Temp 98.0°F | Resp 23 | Ht 65.25 in | Wt 153.0 lb

## 2013-02-20 DIAGNOSIS — K5900 Constipation, unspecified: Secondary | ICD-10-CM | POA: Diagnosis not present

## 2013-02-20 DIAGNOSIS — K921 Melena: Secondary | ICD-10-CM

## 2013-02-20 DIAGNOSIS — I10 Essential (primary) hypertension: Secondary | ICD-10-CM | POA: Diagnosis not present

## 2013-02-20 DIAGNOSIS — E039 Hypothyroidism, unspecified: Secondary | ICD-10-CM | POA: Diagnosis not present

## 2013-02-20 DIAGNOSIS — K5730 Diverticulosis of large intestine without perforation or abscess without bleeding: Secondary | ICD-10-CM

## 2013-02-20 DIAGNOSIS — D126 Benign neoplasm of colon, unspecified: Secondary | ICD-10-CM | POA: Diagnosis not present

## 2013-02-20 MED ORDER — SODIUM CHLORIDE 0.9 % IV SOLN
500.0000 mL | INTRAVENOUS | Status: DC
Start: 2013-02-20 — End: 2013-02-21

## 2013-02-20 NOTE — Op Note (Signed)
[place]  [employer] [place], [id number]   COLONOSCOPY PROCEDURE REPORT  PATIENT: [place], Giammona  MR#: [id number] BIRTHDATE: 1921/12/24 , 89  yrs. old GENDER: Female ENDOSCOPIST: Inda Castle, MD REFERRED BY: PROCEDURE DATE:  02/20/2013 PROCEDURE:   Colonoscopy with snare polypectomy First Screening Colonoscopy - Avg.  risk and is 50 yrs.  old or older - No.  Prior Negative Screening - Now for repeat screening. N/A  History of Adenoma - Now for follow-up colonoscopy & has been > or = to 3 yrs.  N/A  Polyps Removed Today? Yes. ASA CLASS:   Class II INDICATIONS:Change in bowel habits. MEDICATIONS: MAC sedation, administered by CRNA and propofol (Diprivan) 200mg  IV  DESCRIPTION OF PROCEDURE:   After the risks benefits and alternatives of the procedure were thoroughly explained, informed consent was obtained.  A digital rectal exam revealed no abnormalities of the rectum.   The [id number]  endoscope was introduced through the anus and advanced to the cecum, which was identified by both the appendix and ileocecal valve. No adverse events experienced.   The quality of the prep was Suprep good  The instrument was then slowly withdrawn as the colon was fully examined.      COLON FINDINGS: A flat polyp measuring 6 mm in size was found at the cecum.  A polypectomy was performed with a cold snare.  The resection was complete and the polyp tissue was completely retrieved.   A sessile polyp measuring 6 mm in size was found in the sigmoid colon.  A polypectomy was performed using snare cautery.  The resection was complete and the polyp tissue was completely retrieved.   There was severe diverticulosis noted in the sigmoid colon with associated muscular hypertrophy and luminal narrowing. The adult colonoscope passed with mild resistance.  The colon mucosa was otherwise normal.  Retroflexed views revealed no abnormalities. The time to cecum=7 minutes  53 seconds.  Withdrawal time=8 minutes 59 seconds.  The scope was withdrawn and the procedure completed. COMPLICATIONS: There were no complications.  ENDOSCOPIC IMPRESSION: 1.   Flat polyp measuring 6 mm in size was found at the cecum; polypectomy was performed with a cold snare 2.   Sessile polyp measuring 6 mm in size was found in the sigmoid colon; polypectomy was performed using snare cautery 3.   There was severe diverticulosis noted in the sigmoid colon with lumenal narrowing - cause for bowel habits change   RECOMMENDATIONS: 1.  fiber supplementation 2.  no f/u colonoscopy in view of age 74.  OV 4-6 weeks  eSigned:  Inda Castle, MD 02/20/2013 4:48 PM   cc: Webb Silversmith, MD   PATIENT NAME:  [place], Trueba MR#: [id number]

## 2013-02-20 NOTE — Patient Instructions (Signed)
YOU HAD AN ENDOSCOPIC PROCEDURE TODAY AT THE [facility]: Refer to the procedure report that was given to you for any specific questions about what was found during the examination.  If the procedure report does not answer your questions, please call your gastroenterologist to clarify.  If you requested that your care partner not be given the details of your procedure findings, then the procedure report has been included in a sealed envelope for you to review at your convenience later.  YOU SHOULD EXPECT: Some feelings of bloating in the abdomen. Passage of more gas than usual.  Walking can help get rid of the air that was put into your GI tract during the procedure and reduce the bloating. If you had a lower endoscopy (such as a colonoscopy or flexible sigmoidoscopy) you may notice spotting of blood in your stool or on the toilet paper. If you underwent a bowel prep for your procedure, then you may not have a normal bowel movement for a few days.  DIET: Your first meal following the procedure should be a light meal and then it is ok to progress to your normal diet.  A half-sandwich or bowl of soup is an example of a good first meal.  Heavy or fried foods are harder to digest and may make you feel nauseous or bloated.  Likewise meals heavy in dairy and vegetables can cause extra gas to form and this can also increase the bloating.  Drink plenty of fluids but you should avoid alcoholic beverages for 24 hours.  ACTIVITY: Your care partner should take you home directly after the procedure.  You should plan to take it easy, moving slowly for the rest of the day.  You can resume normal activity the day after the procedure however you should NOT DRIVE or use heavy machinery for 24 hours (because of the sedation medicines used during the test).    SYMPTOMS TO REPORT IMMEDIATELY: A gastroenterologist can be reached at any hour.  During normal business hours, 8:30 AM to 5:00 PM Monday through Friday,  call [phone].  After hours and on weekends, please call the GI answering service at [phone] who will take a message and have the physician on call contact you.   Following lower endoscopy (colonoscopy or flexible sigmoidoscopy):  Excessive amounts of blood in the stool  Significant tenderness or worsening of abdominal pains  Swelling of the abdomen that is new, acute  Fever of 100F or higher  FOLLOW UP: If any biopsies were taken you will be contacted by phone or by letter within the next 1-3 weeks.  Call your gastroenterologist if you have not heard about the biopsies in 3 weeks.  Our staff will call the home number listed on your records the next business day following your procedure to check on you and address any questions or concerns that you may have at that time regarding the information given to you following your procedure. This is a courtesy call and so if there is no answer at the home number and we have not heard from you through the emergency physician on call, we will assume that you have returned to your regular daily activities without incident.  SIGNATURES/CONFIDENTIALITY: You and/or your care partner have signed paperwork which will be entered into your electronic medical record.  These signatures attest to the fact that that the information above on your After Visit Summary has been reviewed and is understood.  Full responsibility of the confidentiality of this   discharge information lies with you and/or your care-partner.  Recommendations Fiber supplementation Office visit in 4-6 weeks

## 2013-02-20 NOTE — Progress Notes (Signed)
Report to pacu rn, vss, bbs=clear 

## 2013-02-20 NOTE — Progress Notes (Signed)
No egg or soy allergy. ewm No problems with past sedation. emw 

## 2013-02-20 NOTE — Progress Notes (Signed)
Called to room to assist during endoscopic procedure.  Patient ID and intended procedure confirmed with present staff. Received instructions for my participation in the procedure from the performing physician.  

## 2013-02-21 ENCOUNTER — Telehealth: Payer: Self-pay | Admitting: *Deleted

## 2013-02-21 NOTE — Telephone Encounter (Signed)
  Follow up Call-  Call back number 02/20/2013  Post procedure Call Back phone  # [phone] or daughters cell [phone]  Permission to leave phone message Yes     Patient questions:  Do you have a fever, pain , or abdominal swelling? no Pain Score  0 *  Have you tolerated food without any problems? yes  Have you been able to return to your normal activities? yes  Do you have any questions about your discharge instructions: Diet   no Medications  no Follow up visit  no  Do you have questions or concerns about your Care? no  Actions: * If pain score is 4 or above: No action needed, pain <4.

## 2013-02-26 ENCOUNTER — Encounter: Payer: Self-pay | Admitting: Gastroenterology

## 2013-02-26 DIAGNOSIS — C44319 Basal cell carcinoma of skin of other parts of face: Secondary | ICD-10-CM | POA: Diagnosis not present

## 2013-02-27 ENCOUNTER — Encounter: Payer: Self-pay | Admitting: *Deleted

## 2013-03-21 ENCOUNTER — Ambulatory Visit: Payer: Medicare Other | Admitting: Gastroenterology

## 2013-04-11 DIAGNOSIS — H16219 Exposure keratoconjunctivitis, unspecified eye: Secondary | ICD-10-CM | POA: Diagnosis not present

## 2013-04-23 ENCOUNTER — Ambulatory Visit (INDEPENDENT_AMBULATORY_CARE_PROVIDER_SITE_OTHER): Payer: Medicare Other | Admitting: Gastroenterology

## 2013-04-23 ENCOUNTER — Encounter: Payer: Self-pay | Admitting: Gastroenterology

## 2013-04-23 ENCOUNTER — Other Ambulatory Visit (INDEPENDENT_AMBULATORY_CARE_PROVIDER_SITE_OTHER): Payer: Medicare Other

## 2013-04-23 VITALS — BP 128/72 | HR 88 | Ht 65.25 in | Wt 157.1 lb

## 2013-04-23 DIAGNOSIS — Z860100 Personal history of colon polyps, unspecified: Secondary | ICD-10-CM | POA: Insufficient documentation

## 2013-04-23 DIAGNOSIS — Z8719 Personal history of other diseases of the digestive system: Secondary | ICD-10-CM | POA: Insufficient documentation

## 2013-04-23 DIAGNOSIS — L570 Actinic keratosis: Secondary | ICD-10-CM | POA: Diagnosis not present

## 2013-04-23 DIAGNOSIS — R198 Other specified symptoms and signs involving the digestive system and abdomen: Secondary | ICD-10-CM

## 2013-04-23 DIAGNOSIS — Z85828 Personal history of other malignant neoplasm of skin: Secondary | ICD-10-CM | POA: Diagnosis not present

## 2013-04-23 DIAGNOSIS — R194 Change in bowel habit: Secondary | ICD-10-CM

## 2013-04-23 DIAGNOSIS — D485 Neoplasm of uncertain behavior of skin: Secondary | ICD-10-CM | POA: Diagnosis not present

## 2013-04-23 DIAGNOSIS — D0460 Carcinoma in situ of skin of unspecified upper limb, including shoulder: Secondary | ICD-10-CM | POA: Diagnosis not present

## 2013-04-23 NOTE — Progress Notes (Signed)
          History of Present Illness:  The patient continues to report black stools that are mostly solid.  She complains of mild constipation.  She's taking Benefiber twice a day.  She also takes Aleve daily.  Recent colonoscopy demonstrated nonbleeding adenomas polyps which were removed.  She has no other GI complaints.  Hemoglobin in January was 13.4.    Review of Systems: Pertinent positive and negative review of systems were noted in the above HPI section. All other review of systems were otherwise negative.    Current Medications, Allergies, Past Medical History, Past Surgical History, Family History and Social History were reviewed in [facility] record  Vital signs were reviewed in today's medical record. Physical Exam: General: Well developed , well nourished, no acute distress   See Assessment and Plan under Problem List

## 2013-04-23 NOTE — Assessment & Plan Note (Signed)
Colon polyps were removed at colonoscopy.  These are not bleeding.  Plan no routine followup in view of the patient's age.

## 2013-04-23 NOTE — Assessment & Plan Note (Signed)
Patient reports persistent black but semi-formed stool.  Hemoglobin in January was normal.  Patient is taking Aleve daily reason the question of occult GI bleeding.  Recommendations #1 stool Hemoccults #2 repeat hemoglobin #3 patient will hold Aleve for one week and see if there is a change in the stool color

## 2013-04-23 NOTE — Patient Instructions (Signed)
Go to the basement for labs Call in one week to report the color of your stools

## 2013-04-24 ENCOUNTER — Ambulatory Visit (INDEPENDENT_AMBULATORY_CARE_PROVIDER_SITE_OTHER): Payer: Medicare Other | Admitting: Ophthalmology

## 2013-04-24 DIAGNOSIS — L608 Other nail disorders: Secondary | ICD-10-CM | POA: Diagnosis not present

## 2013-04-24 DIAGNOSIS — D235 Other benign neoplasm of skin of trunk: Secondary | ICD-10-CM | POA: Diagnosis not present

## 2013-04-24 DIAGNOSIS — I739 Peripheral vascular disease, unspecified: Secondary | ICD-10-CM | POA: Diagnosis not present

## 2013-04-24 LAB — CBC WITH DIFFERENTIAL/PLATELET
BASOS PCT: 1.2 % (ref 0.0–3.0)
Basophils Absolute: 0.1 10*3/uL (ref 0.0–0.1)
EOS PCT: 1.2 % (ref 0.0–5.0)
Eosinophils Absolute: 0.1 10*3/uL (ref 0.0–0.7)
HCT: 40.7 % (ref 36.0–46.0)
HEMOGLOBIN: 13.6 g/dL (ref 12.0–15.0)
LYMPHS PCT: 33.6 % (ref 12.0–46.0)
Lymphs Abs: 3.1 10*3/uL (ref 0.7–4.0)
MCHC: 33.3 g/dL (ref 30.0–36.0)
MCV: 89.5 fl (ref 78.0–100.0)
Monocytes Absolute: 0.5 10*3/uL (ref 0.1–1.0)
Monocytes Relative: 5 % (ref 3.0–12.0)
Neutro Abs: 5.4 10*3/uL (ref 1.4–7.7)
Neutrophils Relative %: 59 % (ref 43.0–77.0)
Platelets: 156 10*3/uL (ref 150.0–400.0)
RBC: 4.55 Mil/uL (ref 3.87–5.11)
RDW: 13.8 % (ref 11.5–14.6)
WBC: 9.1 10*3/uL (ref 4.5–10.5)

## 2013-05-10 ENCOUNTER — Other Ambulatory Visit (INDEPENDENT_AMBULATORY_CARE_PROVIDER_SITE_OTHER): Payer: Medicare Other

## 2013-05-10 DIAGNOSIS — R198 Other specified symptoms and signs involving the digestive system and abdomen: Secondary | ICD-10-CM | POA: Diagnosis not present

## 2013-05-10 DIAGNOSIS — Z1231 Encounter for screening mammogram for malignant neoplasm of breast: Secondary | ICD-10-CM | POA: Diagnosis not present

## 2013-05-10 DIAGNOSIS — M899 Disorder of bone, unspecified: Secondary | ICD-10-CM | POA: Diagnosis not present

## 2013-05-10 DIAGNOSIS — M949 Disorder of cartilage, unspecified: Secondary | ICD-10-CM | POA: Diagnosis not present

## 2013-05-10 DIAGNOSIS — R194 Change in bowel habit: Secondary | ICD-10-CM

## 2013-05-10 LAB — FECAL OCCULT BLOOD, IMMUNOCHEMICAL: Fecal Occult Bld: NEGATIVE

## 2013-05-11 ENCOUNTER — Encounter: Payer: Self-pay | Admitting: Gynecology

## 2013-05-11 ENCOUNTER — Other Ambulatory Visit: Payer: Self-pay | Admitting: *Deleted

## 2013-05-11 DIAGNOSIS — H04129 Dry eye syndrome of unspecified lacrimal gland: Secondary | ICD-10-CM | POA: Diagnosis not present

## 2013-05-11 DIAGNOSIS — M8580 Other specified disorders of bone density and structure, unspecified site: Secondary | ICD-10-CM

## 2013-05-14 NOTE — Progress Notes (Signed)
Quick Note:  Please inform the patient that hemeoccult was negative and to continue current plan of action ______ 

## 2013-05-16 ENCOUNTER — Other Ambulatory Visit: Payer: Self-pay | Admitting: *Deleted

## 2013-05-16 DIAGNOSIS — M8580 Other specified disorders of bone density and structure, unspecified site: Secondary | ICD-10-CM

## 2013-05-21 ENCOUNTER — Telehealth: Payer: Self-pay | Admitting: Gynecology

## 2013-05-21 ENCOUNTER — Encounter: Payer: Self-pay | Admitting: Gynecology

## 2013-05-21 NOTE — Telephone Encounter (Signed)
Tell patient I received a copy of her recent bone density which shows osteopenia. There is an increased fracture risk and I would like to talk to her about treatment options. Recommend office visit to discuss treatment options.

## 2013-05-22 NOTE — Telephone Encounter (Signed)
Left message for pt to call.

## 2013-06-01 NOTE — Telephone Encounter (Signed)
Pt informed with the below note, pt will call back to schedule.  

## 2013-07-10 DIAGNOSIS — L608 Other nail disorders: Secondary | ICD-10-CM | POA: Diagnosis not present

## 2013-07-10 DIAGNOSIS — I739 Peripheral vascular disease, unspecified: Secondary | ICD-10-CM | POA: Diagnosis not present

## 2013-07-17 DIAGNOSIS — L820 Inflamed seborrheic keratosis: Secondary | ICD-10-CM | POA: Diagnosis not present

## 2013-07-17 DIAGNOSIS — Z85828 Personal history of other malignant neoplasm of skin: Secondary | ICD-10-CM | POA: Diagnosis not present

## 2013-07-17 DIAGNOSIS — L570 Actinic keratosis: Secondary | ICD-10-CM | POA: Diagnosis not present

## 2013-09-20 DIAGNOSIS — L608 Other nail disorders: Secondary | ICD-10-CM | POA: Diagnosis not present

## 2013-09-20 DIAGNOSIS — I739 Peripheral vascular disease, unspecified: Secondary | ICD-10-CM | POA: Diagnosis not present

## 2013-10-04 ENCOUNTER — Ambulatory Visit (INDEPENDENT_AMBULATORY_CARE_PROVIDER_SITE_OTHER): Payer: Medicare Other | Admitting: Ophthalmology

## 2013-10-04 DIAGNOSIS — I10 Essential (primary) hypertension: Secondary | ICD-10-CM

## 2013-10-04 DIAGNOSIS — H3560 Retinal hemorrhage, unspecified eye: Secondary | ICD-10-CM

## 2013-10-04 DIAGNOSIS — H3530 Unspecified macular degeneration: Secondary | ICD-10-CM | POA: Diagnosis not present

## 2013-10-04 DIAGNOSIS — H43819 Vitreous degeneration, unspecified eye: Secondary | ICD-10-CM

## 2013-10-04 DIAGNOSIS — H35039 Hypertensive retinopathy, unspecified eye: Secondary | ICD-10-CM

## 2013-10-04 DIAGNOSIS — D3130 Benign neoplasm of unspecified choroid: Secondary | ICD-10-CM

## 2013-10-24 DIAGNOSIS — Z23 Encounter for immunization: Secondary | ICD-10-CM | POA: Diagnosis not present

## 2013-11-16 ENCOUNTER — Encounter: Payer: Self-pay | Admitting: Family

## 2013-11-16 ENCOUNTER — Ambulatory Visit (INDEPENDENT_AMBULATORY_CARE_PROVIDER_SITE_OTHER): Payer: Medicare Other | Admitting: Family

## 2013-11-16 VITALS — BP 136/98 | HR 96 | Temp 97.6°F | Resp 18 | Ht 67.0 in | Wt 160.0 lb

## 2013-11-16 DIAGNOSIS — I10 Essential (primary) hypertension: Secondary | ICD-10-CM

## 2013-11-16 MED ORDER — AMLODIPINE BESYLATE 5 MG PO TABS: ORAL_TABLET | ORAL | Status: DC

## 2013-11-16 NOTE — Progress Notes (Signed)
   Subjective:    Patient ID: Summer Rodriguez, female    DOB: 02-Sep-1921, 80 y.o.   MRN: [id number]  Chief Complaint  Patient presents with  . Establish Care    Needs bp meds refilled    HPI:  Summer Rodriguez is a 80 y.o. female who presents today to establish care and discuss blood pressure medications.   1) Hypertension - Currently taking amlodipine. Pt reports condition is stable. Denies any chest pain/discomfort, palpitations, edema, or  shortness of breath of with activity. Last eye exam was within the last 6 months ago.   BP Readings from Last 3 Encounters:  11/16/13 136/98  04/23/13 128/72  02/20/13 145/78   Allergies  Allergen Reactions  . Penicillins Hives and Shortness Of Breath  . Sulfur Hives and Shortness Of Breath  . Codeine Other (See Comments)    "Spaced out" / "Altered reality"   Current Outpatient Prescriptions on File Prior to Visit  Medication Sig Dispense Refill  . beta carotene w/minerals (OCUVITE) tablet Take 1 tablet by mouth daily.      . Glycerin-Hypromellose-PEG 400 (HM DRY EYE RELIEF) 0.2-0.2-1 % SOLN Apply to eye as needed.      . Multiple Minerals-Vitamins (CALCIUM-MAGNESIUM-ZINC) TABS Take 1 tablet by mouth daily.      . naproxen sodium (ALEVE) 220 MG tablet Take 220 mg by mouth as needed.      . Wheat Dextrin (BENEFIBER) POWD Take by mouth daily.      . Cholecalciferol (D3-1000) 1000 UNITS capsule Take 1,000 Units by mouth 2 (two) times daily.       No current facility-administered medications on file prior to visit.   Review of Systems  See HPI    Objective:    BP 136/98  Pulse 96  Temp(Src) 97.6 F (36.4 C) (Oral)  Resp 18  Ht 5\' 7"  (1.702 m)  Wt 160 lb (72.576 kg)  BMI 25.05 kg/m2  SpO2 95% Nursing note and vital signs reviewed.  Physical Exam  Constitutional: She is oriented to person, place, and time. She appears well-developed and well-nourished.  Walks with a cane for assistance  Cardiovascular: Normal rate, regular  rhythm and normal heart sounds.   Pulmonary/Chest: Effort normal and breath sounds normal.  Neurological: She is alert and oriented to person, place, and time. She has normal reflexes.  Skin: Skin is warm and dry.  Psychiatric: She has a normal mood and affect. Her behavior is normal. Judgment and thought content normal.       Assessment & Plan:

## 2013-11-16 NOTE — Patient Instructions (Signed)
Thank you for choosing [facility].  Summary/Instructions:   Your prescription has been sent to your pharmacy  Please schedule a time for your physical Please keep taking your medication as prescribed.

## 2013-11-16 NOTE — Progress Notes (Signed)
Pre visit review using our clinic review tool, if applicable. No additional management support is needed unless otherwise documented below in the visit note. 

## 2013-11-16 NOTE — Assessment & Plan Note (Signed)
Retake BP was 138/88. Blood pressure appears stable with current amlodipine. Eye exam has been completed recently. Will need BMET at next visit to determine kidney function. Continue current amlodipine - Rx refilled.

## 2013-11-19 ENCOUNTER — Telehealth: Payer: Self-pay | Admitting: Internal Medicine

## 2013-11-19 NOTE — Telephone Encounter (Signed)
emmi emailed °

## 2013-11-26 ENCOUNTER — Encounter: Payer: Self-pay | Admitting: Family

## 2013-12-06 ENCOUNTER — Encounter: Payer: Self-pay | Admitting: Family

## 2013-12-06 ENCOUNTER — Other Ambulatory Visit (INDEPENDENT_AMBULATORY_CARE_PROVIDER_SITE_OTHER): Payer: Medicare Other

## 2013-12-06 ENCOUNTER — Ambulatory Visit (INDEPENDENT_AMBULATORY_CARE_PROVIDER_SITE_OTHER): Payer: Medicare Other | Admitting: Family

## 2013-12-06 ENCOUNTER — Other Ambulatory Visit: Payer: Self-pay | Admitting: Family

## 2013-12-06 VITALS — BP 162/95 | HR 85 | Temp 97.7°F | Resp 16 | Ht 66.5 in | Wt 159.0 lb

## 2013-12-06 DIAGNOSIS — I10 Essential (primary) hypertension: Secondary | ICD-10-CM

## 2013-12-06 DIAGNOSIS — Z0000 Encounter for general adult medical examination without abnormal findings: Secondary | ICD-10-CM

## 2013-12-06 DIAGNOSIS — R221 Localized swelling, mass and lump, neck: Secondary | ICD-10-CM | POA: Diagnosis not present

## 2013-12-06 DIAGNOSIS — E049 Nontoxic goiter, unspecified: Secondary | ICD-10-CM | POA: Diagnosis not present

## 2013-12-06 LAB — LIPID PANEL
CHOL/HDL RATIO: 3
Cholesterol: 192 mg/dL (ref 0–200)
HDL: 67.2 mg/dL (ref >39.00)
LDL Cholesterol: 113 mg/dL — ABNORMAL HIGH (ref 0–99)
NonHDL: 124.8
Triglycerides: 57 mg/dL (ref 0.0–149.0)
VLDL: 11.4 mg/dL (ref 0.0–40.0)

## 2013-12-06 LAB — BASIC METABOLIC PANEL
BUN: 17 mg/dL (ref 6–23)
CALCIUM: 10.9 mg/dL — AB (ref 8.4–10.5)
CO2: 26 mEq/L (ref 19–32)
Chloride: 108 mEq/L (ref 96–112)
Creatinine, Ser: 1.3 mg/dL — ABNORMAL HIGH (ref 0.4–1.2)
GFR: 42.03 mL/min — ABNORMAL LOW (ref >60.00)
GLUCOSE: 104 mg/dL — AB (ref 70–99)
Potassium: 4.7 mEq/L (ref 3.5–5.1)
Sodium: 141 mEq/L (ref 135–145)

## 2013-12-06 LAB — CBC
HEMATOCRIT: 41.1 % (ref 36.0–46.0)
Hemoglobin: 13.5 g/dL (ref 12.0–15.0)
MCHC: 32.8 g/dL (ref 30.0–36.0)
MCV: 89 fl (ref 78.0–100.0)
PLATELETS: 174 10*3/uL (ref 150.0–400.0)
RBC: 4.62 Mil/uL (ref 3.87–5.11)
RDW: 13.3 % (ref 11.5–15.5)
WBC: 8.6 10*3/uL (ref 4.0–10.5)

## 2013-12-06 LAB — TSH: TSH: 2.13 u[IU]/mL (ref 0.35–4.50)

## 2013-12-06 MED ORDER — LISINOPRIL 5 MG PO TABS: 5.0000 mg | ORAL_TABLET | Freq: Every day | ORAL | Status: DC

## 2013-12-06 NOTE — Patient Instructions (Addendum)
Thank you for choosing [facility].  Summary/Instructions:   Please stop by the lab for blood work  We will be in contact regarding your results within about 24-48 hours  We have referred you for an ultrasound. You will hear back from our schedulers in about 7 days. Please let us know if you do not hear back.  Please continue your current healthy habits. Follow up blood pressure in about 3 months.

## 2013-12-06 NOTE — Progress Notes (Signed)
Pre visit review using our clinic review tool, if applicable. No additional management support is needed unless otherwise documented below in the visit note. 

## 2013-12-06 NOTE — Progress Notes (Signed)
Subjective:   Patient ID: Summer Rodriguez, female    DOB: 21-Aug-1921, 80 y.o.   MRN: [id number]  Chief Complaint  Patient presents with  . CPE    Annual wellness visit, fasting and lump in neck      Summer Rodriguez is a 80 y.o. female who presents for Medicare Annual/Subsequent preventive examination.   Preventive Screening-Counseling & Management  Tobacco History  Smoking status  . Never Smoker   Smokeless tobacco  . Never Used   Problems Prior to Visit 1. Hypertension - currently maintained on amoldipine. Has been taking blood pressure at home been in the 130's.   BP Readings from Last 3 Encounters:  12/06/13 162/95  11/16/13 136/98  04/23/13 128/72    2) Lump neck - has been going on for about year and half or so. Increases and decreases in size. Sometimes it effects her ability to swallow. There is nothing certain that makes it better or worse. There are no treatments that she has done for it. ENT evaluated indicating "part of her body."   Patient Active Problem List   Diagnosis Date Noted  . Encounter for Medicare annual wellness exam 12/06/2013  . History of melena 04/23/2013  . Personal history of colonic polyps 04/23/2013  . Unspecified constipation 06/22/2012  . Essential hypertension, benign 06/20/2012  . Hyperthyroidism 06/20/2012  . Skin lesion 06/20/2012    Current Problems (verified)   Medications Prior to Visit Current Outpatient Prescriptions on File Prior to Visit  Medication Sig Dispense Refill  . amLODipine (NORVASC) 5 MG tablet TAKE ONE TABLET BY MOUTH ONCE DAILY 90 tablet 4  . beta carotene w/minerals (OCUVITE) tablet Take 1 tablet by mouth daily.    . Cholecalciferol (D3-1000) 1000 UNITS capsule Take 1,000 Units by mouth 2 (two) times daily.    . Glycerin-Hypromellose-PEG 400 (HM DRY EYE RELIEF) 0.2-0.2-1 % SOLN Apply to eye as needed.    . Multiple Minerals-Vitamins (CALCIUM-MAGNESIUM-ZINC) TABS Take 1 tablet by mouth daily.    . naproxen  sodium (ALEVE) 220 MG tablet Take 220 mg by mouth as needed.    . Wheat Dextrin (BENEFIBER) POWD Take by mouth daily.     No current facility-administered medications on file prior to visit.    Current Medications (verified) Current Outpatient Prescriptions  Medication Sig Dispense Refill  . amLODipine (NORVASC) 5 MG tablet TAKE ONE TABLET BY MOUTH ONCE DAILY 90 tablet 4  . beta carotene w/minerals (OCUVITE) tablet Take 1 tablet by mouth daily.    . Cholecalciferol (D3-1000) 1000 UNITS capsule Take 1,000 Units by mouth 2 (two) times daily.    . Glycerin-Hypromellose-PEG 400 (HM DRY EYE RELIEF) 0.2-0.2-1 % SOLN Apply to eye as needed.    . Multiple Minerals-Vitamins (CALCIUM-MAGNESIUM-ZINC) TABS Take 1 tablet by mouth daily.    . naproxen sodium (ALEVE) 220 MG tablet Take 220 mg by mouth as needed.    . Wheat Dextrin (BENEFIBER) POWD Take by mouth daily.     No current facility-administered medications for this visit.     Allergies (verified) Penicillins; Sulfur; and Codeine   PAST HISTORY  Past Medical History  Diagnosis Date  . Hypertension   . Allergy   . Arthritis   . Urine incontinence   . Hyperlipemia   . Arthritis   . Basal cell carcinoma     on nose  . Osteopenia 04/2013    T score -2.3 FRAX 13.9%/4.7% statistical decline at spine and hips from prior study  Past Surgical History  Procedure Laterality Date  . Cholecystectomy    . Ovarian cyst surgery    . Breast surgery  1977    Bilateral partial mastectomy-no breast cancer  . Basal cell carcinoma excision      under nose    Family History Family History  Problem Relation Age of Onset  . Ovarian cancer Mother   . Cancer Mother     Ovarian/Uterine Cancer  . Diabetes Neg Hx   . Stroke Neg Hx   . Colon cancer Neg Hx   . Rectal cancer Neg Hx   . Stomach cancer Neg Hx     Social History History  Substance Use Topics  . Smoking status: Never Smoker   . Smokeless tobacco: Never Used  . Alcohol Use: No       Comment: Rare    Are there smokers in your home (other than you)?  No  Risk Factors Current exercise habits: Getting up and walking around the house.  Dietary issues discussed: None  Cardiac risk factors: advanced age (older than 63 for men, 33 for women) and hypertension.  Depression Screen  Q1: Over the past two weeks, have you felt down, depressed or hopeless? No  Q2: Over the past two weeks, have you felt little interest or pleasure in doing things? No  Have you lost interest or pleasure in daily life? No  Do you often feel hopeless? No  Do you cry easily over simple problems? No  Activities of Daily Living In your present state of health, do you have any difficulty performing the following activities?:  Driving? No Managing money?  No Feeding yourself? No Getting from bed to chair? No Climbing a flight of stairs? No Preparing food and eating?: No Bathing or showering? No Getting dressed: No Getting to the toilet? No Using the toilet: No Moving around from place to place: No In the past year have you fallen or had a near fall?:No  Are you sexually active?  Yes  Do you have more than one partner?  No  Home Safety: Has smoke detector and wears seat belts. No firearms. No excess sun exposure.  Hearing Difficulties: No Do you often ask people to speak up or repeat themselves? No Do you experience ringing or noises in your ears? No Do you have difficulty understanding soft or whispered voices? No  Do you feel that you have a problem with memory? No  Do you often misplace items? No  Do you feel safe at home?  Yes  Cognitive Testing  Alert? Yes   Normal Appearance? Yes  Oriented to person? Yes  Place? Yes   Time? Yes  Recall of three objects?  Yes  Can perform simple calculations? Yes  Displays appropriate judgment? Yes  Can read the correct time from a watch face? Yes   Advanced Directives have been discussed with the patient? Living will, power of attorney,  healthcare power of attorney.    List the Names of Other Physician/Practitioners you currently use: 1.  Dr. Donalynn Furlong 2. Dr. Rodman Key  - eyes - retinal specialist 3. Dr. Crista Luria - Dermatology 4.  Dr. Deatra Ina - GI  Indicate any recent Medical Services you may have received from other than [facility] providers in the past year (date may be approximate).  Immunization History  Administered Date(s) Administered  . Pneumococcal-Unspecified 01/25/2010  . Td 01/26/2007    Screening Tests Health Maintenance  Topic Date Due  . ZOSTAVAX  01/23/1984  .  INFLUENZA VACCINE  08/25/2013  . TETANUS/TDAP  01/25/2017  . COLONOSCOPY  02/21/2023  . PNEUMOCOCCAL POLYSACCHARIDE VACCINE AGE 40 AND OVER  Completed  Declined Zostavax.   All answers were reviewed with the patient and necessary referrals were made:  Mauricio Po, [place]   12/06/2013    Review of Systems Constitutional: Denies fever, chills, fatigue, or significant weight gain/loss. HENT: Head: Denies headache or neck pain Ears: Denies changes in hearing, ringing in ears, earache, drainage Nose: Denies discharge, stuffiness, itching, nosebleed, sinus pain Throat: Denies sore throat, hoarseness, dry mouth, sores, thrush Eyes: Denies loss/changes in vision, pain, redness, blurry/double vision, flashing lights Cardiovascular: Denies chest pain/discomfort, tightness, palpitations, shortness of breath with activity, difficulty lying down, swelling, sudden awakening with shortness of breath Respiratory: Denies shortness of breath, cough, sputum production, wheezing Gastrointestinal: Denies dysphasia, heartburn, change in appetite, nausea, change in bowel habits, rectal bleeding, constipation, diarrhea, yellow skin or eyes Genitourinary: Denies frequency, urgency, burning/pain, blood in urine, incontinence, change in urinary strength. Musculoskeletal: Denies muscle/joint pain, stiffness, back pain, redness or swelling of joints,  trauma Skin: Denies rashes, lumps, itching, dryness, color changes, or hair/nail changes Neurological: Denies dizziness, fainting, seizures, weakness, numbness, tingling, tremor Psychiatric - Denies nervousness, stress, depression or memory loss Endocrine: Denies heat or cold intolerance, sweating, frequent urination, excessive thirst, changes in appetite Hematologic: Denies ease of bruising or bleeding   Objective:    Vision by Snellen chart: right eye 20/60 left eye: 20/60 BP 162/95 mmHg  Pulse 85  Temp(Src) 97.7 F (36.5 C) (Oral)  Resp 16  Ht 5' 6.5" (1.689 m)  Wt 159 lb (72.122 kg)  BMI 25.28 kg/m2  SpO2 92%  Physical Exam  Constitutional: She is oriented to person, place, and time. She appears well-developed and well-nourished. No distress.  Neck: Neck supple. No JVD present. No tracheal deviation present. No thyromegaly present.  Palpable nodule noted midway between mandible and clavicle on the left lateral side of the neck. Nodule is mobile, and mildly tender to the touch.   Cardiovascular: Normal rate, regular rhythm and normal heart sounds.   Respiratory: Effort normal and breath sounds normal.  Neurological: She is alert and oriented to person, place, and time.  Skin: Skin is warm and dry.  Psychiatric: She has a normal mood and affect. Her behavior is normal. Judgment and thought content normal.         Assessment:          Plan:     During the course of the visit the patient was educated and counseled about appropriate screening and preventive services including:    Nutrition counseling   Diet review for nutrition referral? Yes ____  Not Indicated _X___   Patient Instructions (the written plan) was given to the patient.  Medicare Attestation I have personally reviewed: The patient's medical and social history Their use of alcohol, tobacco or illicit drugs Their current medications and supplements The patient's functional ability including  ADLs,fall risks, home safety risks, cognitive, and hearing and visual impairment Diet and physical activities Evidence for depression or mood disorders  The patient's weight, height, BMI, and visual acuity have been recorded in the chart.  I have made referrals, counseling, and provided education to the patient based on review of the above and I have provided the patient with a written personalized care plan for preventive services.     Mauricio Po, FNP   12/06/2013

## 2013-12-06 NOTE — Assessment & Plan Note (Signed)
AWV completed; screenings are up to date. Pt declined Zostavax at this time.

## 2013-12-06 NOTE — Assessment & Plan Note (Signed)
Currently stable with home readings. Increased reading today appears as an anomaly. Continue current amlodipine. Follow up for blood pressure in 3 months.

## 2013-12-07 ENCOUNTER — Ambulatory Visit
Admission: RE | Admit: 2013-12-07 | Discharge: 2013-12-07 | Disposition: A | Discharge status: Home / Self Care | Payer: Medicare Other | Source: Ambulatory Visit | Attending: Family | Admitting: Family

## 2013-12-07 ENCOUNTER — Encounter: Payer: Self-pay | Admitting: Family

## 2013-12-07 ENCOUNTER — Other Ambulatory Visit: Payer: Self-pay | Admitting: Family

## 2013-12-07 DIAGNOSIS — R221 Localized swelling, mass and lump, neck: Secondary | ICD-10-CM

## 2013-12-07 DIAGNOSIS — E042 Nontoxic multinodular goiter: Secondary | ICD-10-CM | POA: Diagnosis not present

## 2013-12-07 DIAGNOSIS — E049 Nontoxic goiter, unspecified: Secondary | ICD-10-CM

## 2013-12-31 ENCOUNTER — Ambulatory Visit (INDEPENDENT_AMBULATORY_CARE_PROVIDER_SITE_OTHER): Payer: Medicare Other | Admitting: Endocrinology

## 2013-12-31 ENCOUNTER — Encounter: Payer: Self-pay | Admitting: Endocrinology

## 2013-12-31 VITALS — BP 128/82 | HR 91 | Temp 97.9°F | Ht 66.5 in | Wt 159.0 lb

## 2013-12-31 DIAGNOSIS — E042 Nontoxic multinodular goiter: Secondary | ICD-10-CM | POA: Diagnosis not present

## 2013-12-31 DIAGNOSIS — R221 Localized swelling, mass and lump, neck: Secondary | ICD-10-CM | POA: Diagnosis not present

## 2013-12-31 DIAGNOSIS — R1310 Dysphagia, unspecified: Secondary | ICD-10-CM | POA: Diagnosis not present

## 2013-12-31 NOTE — Progress Notes (Signed)
Subjective:    Patient ID: Summer Rodriguez, female    DOB: [birth year], 80 y.o.   MRN: [id number]  HPI Hx is from pt and dtr.  Pt reports 18 mos of moderate swelling at the anterior neck, but no assoc pain.  She has no prior h/o thyroid probs.  she has never been on prescribed thyroid hormone therapy.  He has never taken kelp or any other type of non-prescribed thyroid product.  she has never had thyroid surgery, or XRT to the neck.  He has never been on amiodarone or lithium.   Past Medical History  Diagnosis Date  . Hypertension   . Allergy   . Arthritis   . Urine incontinence   . Hyperlipemia   . Arthritis   . Basal cell carcinoma     on nose  . Osteopenia 04/2013    T score -2.3 FRAX 13.9%/4.7% statistical decline at spine and hips from prior study    Past Surgical History  Procedure Laterality Date  . Cholecystectomy    . Ovarian cyst surgery    . Breast surgery  1977    Bilateral partial mastectomy-no breast cancer  . Basal cell carcinoma excision      under nose    History   Social History  . Marital Status: Widowed    Spouse Name: N/A    Number of Children: 2  . Years of Education: 14   Occupational History  . Retired    Social History Main Topics  . Smoking status: Never Smoker   . Smokeless tobacco: Never Used  . Alcohol Use: No     Comment: Rare  . Drug Use: No  . Sexual Activity: No   Other Topics Concern  . Not on file   Social History Narrative   Regular exercise-no   Caffeine Use-yes    Current Outpatient Prescriptions on File Prior to Visit  Medication Sig Dispense Refill  . amLODipine (NORVASC) 5 MG tablet TAKE ONE TABLET BY MOUTH ONCE DAILY 90 tablet 4  . beta carotene w/minerals (OCUVITE) tablet Take 1 tablet by mouth daily.    . Cholecalciferol (D3-1000) 1000 UNITS capsule Take 1,000 Units by mouth 2 (two) times daily.    . Glycerin-Hypromellose-PEG 400 (HM DRY EYE RELIEF) 0.2-0.2-1 % SOLN Apply to eye as needed.    Marland Kitchen lisinopril  (PRINIVIL,ZESTRIL) 5 MG tablet Take 1 tablet (5 mg total) by mouth daily. 90 tablet 0  . Multiple Minerals-Vitamins (CALCIUM-MAGNESIUM-ZINC) TABS Take 1 tablet by mouth daily.    . naproxen sodium (ALEVE) 220 MG tablet Take 220 mg by mouth as needed.    . Wheat Dextrin (BENEFIBER) POWD Take by mouth daily.     No current facility-administered medications on file prior to visit.    Allergies  Allergen Reactions  . Penicillins Hives and Shortness Of Breath  . Sulfur Hives and Shortness Of Breath  . Codeine Other (See Comments)    "Spaced out" / "Altered reality"    Family History  Problem Relation Age of Onset  . Ovarian cancer Mother   . Cancer Mother     Ovarian/Uterine Cancer  . Diabetes Neg Hx   . Stroke Neg Hx   . Colon cancer Neg Hx   . Rectal cancer Neg Hx   . Stomach cancer Neg Hx   . Thyroid disease Daughter     BP 128/82 mmHg  Pulse 91  Temp(Src) 97.9 F (36.6 C) (Oral)  Ht 5' 6.5" (1.689 m)  Wt 159 lb (72.122 kg)  BMI 25.28 kg/m2  SpO2 97%     Review of Systems She has slight dysphagia (pt says she saw ENT Dr, who did not find an anatomic cause).  She has hair loss, constipation, dry skin, rhinorrhea, blurry vision, cold intolerance, and dry skin.  denies depression, muscle cramps, sob, weight gain, numbness, easy bruising, and syncope.      Objective:   Physical Exam Vital signs: see vs page Gen: elderly, frail, no distress Neck: I think I can feel the top of the right thyroid mass, but exam is limited by pt's posture.   Gait: steady with a cane.   Lab Results  Component Value Date   TSH 2.13 12/06/2013   T3TOTAL 107.9 06/20/2012  radiol: i reviewed [place] report    Assessment & Plan:  Multinodular goiter, with a dominant right thyroid mass, new to me Dysphagia: unlikely thyroid-related Frail elderly state.  i this setting pt and dtr decline bx, and I agree.  If necessary, we could ablate the thyroid with high-dose I-131 rx, but there is no  indication for this now.  Patient is advised the following: Patient Instructions  A swallowing test is requested for you today.  We'll let you know about the results.  Please come back for a follow-up appointment in 6 months.

## 2013-12-31 NOTE — Patient Instructions (Addendum)
A swallowing test is requested for you today.  We'll let you know about the results.  Please come back for a follow-up appointment in 6 months.

## 2014-01-01 ENCOUNTER — Encounter: Payer: Self-pay | Admitting: Family

## 2014-01-10 DIAGNOSIS — Z86018 Personal history of other benign neoplasm: Secondary | ICD-10-CM | POA: Diagnosis not present

## 2014-01-10 DIAGNOSIS — D485 Neoplasm of uncertain behavior of skin: Secondary | ICD-10-CM | POA: Diagnosis not present

## 2014-01-10 DIAGNOSIS — D2212 Melanocytic nevi of left eyelid, including canthus: Secondary | ICD-10-CM | POA: Diagnosis not present

## 2014-01-10 DIAGNOSIS — L820 Inflamed seborrheic keratosis: Secondary | ICD-10-CM | POA: Diagnosis not present

## 2014-01-10 DIAGNOSIS — L219 Seborrheic dermatitis, unspecified: Secondary | ICD-10-CM | POA: Diagnosis not present

## 2014-01-10 DIAGNOSIS — D099 Carcinoma in situ, unspecified: Secondary | ICD-10-CM | POA: Diagnosis not present

## 2014-01-10 DIAGNOSIS — L853 Xerosis cutis: Secondary | ICD-10-CM | POA: Diagnosis not present

## 2014-01-10 DIAGNOSIS — Z85828 Personal history of other malignant neoplasm of skin: Secondary | ICD-10-CM | POA: Diagnosis not present

## 2014-01-10 DIAGNOSIS — L570 Actinic keratosis: Secondary | ICD-10-CM | POA: Diagnosis not present

## 2014-01-10 DIAGNOSIS — D239 Other benign neoplasm of skin, unspecified: Secondary | ICD-10-CM | POA: Diagnosis not present

## 2014-01-28 ENCOUNTER — Ambulatory Visit
Admission: RE | Admit: 2014-01-28 | Discharge: 2014-01-28 | Disposition: A | Discharge status: Home / Self Care | Payer: Medicare Other | Source: Ambulatory Visit | Attending: Endocrinology | Admitting: Endocrinology

## 2014-01-28 DIAGNOSIS — R1310 Dysphagia, unspecified: Secondary | ICD-10-CM

## 2014-01-28 DIAGNOSIS — K228 Other specified diseases of esophagus: Secondary | ICD-10-CM | POA: Diagnosis not present

## 2014-02-04 ENCOUNTER — Ambulatory Visit (INDEPENDENT_AMBULATORY_CARE_PROVIDER_SITE_OTHER): Payer: Medicare Other | Admitting: Ophthalmology

## 2014-02-25 ENCOUNTER — Encounter: Payer: Self-pay | Admitting: Endocrinology

## 2014-02-25 ENCOUNTER — Other Ambulatory Visit: Payer: Self-pay | Admitting: Endocrinology

## 2014-02-25 DIAGNOSIS — R221 Localized swelling, mass and lump, neck: Secondary | ICD-10-CM

## 2014-02-25 DIAGNOSIS — E042 Nontoxic multinodular goiter: Secondary | ICD-10-CM

## 2014-02-25 NOTE — Telephone Encounter (Signed)
please call patient: i ordered

## 2014-02-25 NOTE — Telephone Encounter (Signed)
Pt's daughter advised referral had been made.

## 2014-03-04 ENCOUNTER — Ambulatory Visit (INDEPENDENT_AMBULATORY_CARE_PROVIDER_SITE_OTHER): Payer: Medicare Other | Admitting: Ophthalmology

## 2014-03-04 DIAGNOSIS — H3531 Nonexudative age-related macular degeneration: Secondary | ICD-10-CM

## 2014-03-04 DIAGNOSIS — H35033 Hypertensive retinopathy, bilateral: Secondary | ICD-10-CM

## 2014-03-04 DIAGNOSIS — I10 Essential (primary) hypertension: Secondary | ICD-10-CM | POA: Diagnosis not present

## 2014-03-04 DIAGNOSIS — H43813 Vitreous degeneration, bilateral: Secondary | ICD-10-CM

## 2014-03-04 DIAGNOSIS — D3131 Benign neoplasm of right choroid: Secondary | ICD-10-CM | POA: Diagnosis not present

## 2014-03-04 DIAGNOSIS — H3561 Retinal hemorrhage, right eye: Secondary | ICD-10-CM

## 2014-03-04 DIAGNOSIS — H33303 Unspecified retinal break, bilateral: Secondary | ICD-10-CM

## 2014-03-06 ENCOUNTER — Encounter: Payer: Self-pay | Admitting: Family

## 2014-03-06 ENCOUNTER — Ambulatory Visit (INDEPENDENT_AMBULATORY_CARE_PROVIDER_SITE_OTHER): Payer: Medicare Other | Admitting: Family

## 2014-03-06 VITALS — BP 150/88 | HR 98 | Temp 97.6°F | Resp 18 | Ht 66.5 in | Wt 154.0 lb

## 2014-03-06 DIAGNOSIS — I10 Essential (primary) hypertension: Secondary | ICD-10-CM

## 2014-03-06 MED ORDER — LISINOPRIL 5 MG PO TABS: 5.0000 mg | ORAL_TABLET | Freq: Every day | ORAL | Status: DC

## 2014-03-06 NOTE — Patient Instructions (Signed)
Thank you for choosing [facility].  Summary/Instructions:  Please continue to take your medications as prescribed.  Please consider adding Boost, Boost Breeze or Ensure to supplement meals as needed and increase energy.  If your symptoms worsen or fail to improve, please contact our office for further instruction, or in case of emergency go directly to the emergency room at the closest medical facility.

## 2014-03-06 NOTE — Progress Notes (Signed)
   Subjective:    Patient ID: Summer Rodriguez, female    DOB: 06-16-1921, 81 y.o.   MRN: [id number]  Chief Complaint  Patient presents with  . Follow-up    BP check and medication check, lisinopril makes her very tired     HPI:  Summer Rodriguez is a 81 y.o. female who presents today for follow up of her blood pressure.   Her blood pressure readings at home are in similar areas to today's reading with a generally lower diastolic number. She is currently stable and treated with lisinopril and amlodipine. Does report the associated symptom of fatigue but believes this to be related to a recent stomach virus that she is recovering from.  BP Readings from Last 3 Encounters:  03/06/14 150/88  12/31/13 128/82  12/06/13 162/95    Allergies  Allergen Reactions  . Penicillins Hives and Shortness Of Breath  . Sulfur Hives and Shortness Of Breath  . Codeine Other (See Comments)    "Spaced out" / "Altered reality"    Current Outpatient Prescriptions on File Prior to Visit  Medication Sig Dispense Refill  . amLODipine (NORVASC) 5 MG tablet TAKE ONE TABLET BY MOUTH ONCE DAILY 90 tablet 4  . beta carotene w/minerals (OCUVITE) tablet Take 1 tablet by mouth daily.    . Cholecalciferol (D3-1000) 1000 UNITS capsule Take 1,000 Units by mouth 2 (two) times daily.    . Glycerin-Hypromellose-PEG 400 (HM DRY EYE RELIEF) 0.2-0.2-1 % SOLN Apply to eye as needed.    . Multiple Minerals-Vitamins (CALCIUM-MAGNESIUM-ZINC) TABS Take 1 tablet by mouth daily.    . naproxen sodium (ALEVE) 220 MG tablet Take 220 mg by mouth as needed.    . Wheat Dextrin (BENEFIBER) POWD Take by mouth daily.     No current facility-administered medications on file prior to visit.    Review of Systems  Eyes:       Negative for changes in vision.  Respiratory: Negative for chest tightness.   Cardiovascular: Negative for chest pain, palpitations and leg swelling.      Objective:    BP 150/88 mmHg  Pulse 98  Temp(Src)  97.6 F (36.4 C) (Oral)  Resp 18  Ht 5' 6.5" (1.689 m)  Wt 154 lb (69.854 kg)  BMI 24.49 kg/m2  SpO2 96% Nursing note and vital signs reviewed.  Physical Exam  Constitutional: She is oriented to person, place, and time. She appears well-developed and well-nourished. No distress.  Cardiovascular: Normal rate, regular rhythm, normal heart sounds and intact distal pulses.   Pulmonary/Chest: Effort normal and breath sounds normal.  Neurological: She is alert and oriented to person, place, and time.  Skin: Skin is warm and dry.  Psychiatric: She has a normal mood and affect. Her behavior is normal. Judgment and thought content normal.       Assessment & Plan:

## 2014-03-06 NOTE — Progress Notes (Signed)
Pre visit review using our clinic review tool, if applicable. No additional management support is needed unless otherwise documented below in the visit note. 

## 2014-03-06 NOTE — Assessment & Plan Note (Signed)
Blood pressure remains stable with current regimen. Slightly elevated from goal of 140/90. Continue current dosages of amlodipine and lisinopril. If increased blood pressure continues, consider increasing either one of these medications.

## 2014-03-14 ENCOUNTER — Other Ambulatory Visit: Payer: Self-pay | Admitting: Family

## 2014-03-14 ENCOUNTER — Encounter: Payer: Self-pay | Admitting: Family

## 2014-03-14 DIAGNOSIS — E049 Nontoxic goiter, unspecified: Secondary | ICD-10-CM

## 2014-03-21 DIAGNOSIS — I739 Peripheral vascular disease, unspecified: Secondary | ICD-10-CM | POA: Diagnosis not present

## 2014-03-21 DIAGNOSIS — L603 Nail dystrophy: Secondary | ICD-10-CM | POA: Diagnosis not present

## 2014-03-26 ENCOUNTER — Other Ambulatory Visit: Payer: Self-pay | Admitting: Family

## 2014-04-18 DIAGNOSIS — E041 Nontoxic single thyroid nodule: Secondary | ICD-10-CM | POA: Diagnosis not present

## 2014-04-18 DIAGNOSIS — I10 Essential (primary) hypertension: Secondary | ICD-10-CM | POA: Diagnosis not present

## 2014-04-23 ENCOUNTER — Other Ambulatory Visit (HOSPITAL_COMMUNITY): Payer: Self-pay | Admitting: Endocrinology

## 2014-04-23 DIAGNOSIS — E041 Nontoxic single thyroid nodule: Secondary | ICD-10-CM

## 2014-04-29 ENCOUNTER — Encounter (HOSPITAL_COMMUNITY)
Admission: RE | Admit: 2014-04-29 | Discharge: 2014-04-29 | Disposition: A | Discharge status: Home / Self Care | Payer: Medicare Other | Source: Ambulatory Visit | Attending: Endocrinology | Admitting: Endocrinology

## 2014-04-29 DIAGNOSIS — E041 Nontoxic single thyroid nodule: Secondary | ICD-10-CM

## 2014-04-29 DIAGNOSIS — E049 Nontoxic goiter, unspecified: Secondary | ICD-10-CM | POA: Insufficient documentation

## 2014-04-29 MED ORDER — SODIUM IODIDE I 131 CAPSULE
15.1000 | Freq: Once | INTRAVENOUS | Status: AC | PRN
  Administered 2014-04-29: 15.1 via ORAL

## 2014-04-30 ENCOUNTER — Encounter (HOSPITAL_COMMUNITY)
Admission: RE | Admit: 2014-04-30 | Discharge: 2014-04-30 | Disposition: A | Discharge status: Home / Self Care | Payer: Medicare Other | Source: Ambulatory Visit | Attending: Endocrinology | Admitting: Endocrinology

## 2014-04-30 DIAGNOSIS — E049 Nontoxic goiter, unspecified: Secondary | ICD-10-CM | POA: Diagnosis not present

## 2014-04-30 DIAGNOSIS — E041 Nontoxic single thyroid nodule: Secondary | ICD-10-CM | POA: Diagnosis not present

## 2014-04-30 MED ORDER — SODIUM PERTECHNETATE TC 99M INJECTION
10.0000 | Freq: Once | INTRAVENOUS | Status: AC | PRN
  Administered 2014-04-30: 10 via INTRAVENOUS

## 2014-05-13 DIAGNOSIS — E041 Nontoxic single thyroid nodule: Secondary | ICD-10-CM | POA: Diagnosis not present

## 2014-05-14 ENCOUNTER — Other Ambulatory Visit: Payer: Self-pay | Admitting: Endocrinology

## 2014-05-14 DIAGNOSIS — E041 Nontoxic single thyroid nodule: Secondary | ICD-10-CM

## 2014-06-11 ENCOUNTER — Encounter: Payer: Self-pay | Admitting: Gynecology

## 2014-06-11 DIAGNOSIS — Z1231 Encounter for screening mammogram for malignant neoplasm of breast: Secondary | ICD-10-CM | POA: Diagnosis not present

## 2014-06-26 ENCOUNTER — Other Ambulatory Visit: Payer: Self-pay | Admitting: Family

## 2014-06-26 DIAGNOSIS — E041 Nontoxic single thyroid nodule: Secondary | ICD-10-CM | POA: Diagnosis not present

## 2014-07-02 ENCOUNTER — Ambulatory Visit: Payer: Medicare Other | Admitting: Endocrinology

## 2014-07-22 ENCOUNTER — Other Ambulatory Visit: Payer: Self-pay

## 2014-07-24 DIAGNOSIS — M25561 Pain in right knee: Secondary | ICD-10-CM | POA: Diagnosis not present

## 2014-07-24 DIAGNOSIS — M1712 Unilateral primary osteoarthritis, left knee: Secondary | ICD-10-CM | POA: Diagnosis not present

## 2014-07-24 DIAGNOSIS — M1711 Unilateral primary osteoarthritis, right knee: Secondary | ICD-10-CM | POA: Diagnosis not present

## 2014-08-01 DIAGNOSIS — H3530 Unspecified macular degeneration: Secondary | ICD-10-CM | POA: Diagnosis not present

## 2014-09-04 DIAGNOSIS — M1712 Unilateral primary osteoarthritis, left knee: Secondary | ICD-10-CM | POA: Diagnosis not present

## 2014-09-04 DIAGNOSIS — M1711 Unilateral primary osteoarthritis, right knee: Secondary | ICD-10-CM | POA: Diagnosis not present

## 2014-09-10 ENCOUNTER — Other Ambulatory Visit: Payer: Self-pay | Admitting: Family

## 2014-09-12 ENCOUNTER — Other Ambulatory Visit: Payer: Self-pay | Admitting: Family

## 2014-10-30 DIAGNOSIS — M1711 Unilateral primary osteoarthritis, right knee: Secondary | ICD-10-CM | POA: Diagnosis not present

## 2014-10-30 DIAGNOSIS — M545 Low back pain: Secondary | ICD-10-CM | POA: Diagnosis not present

## 2014-10-30 DIAGNOSIS — M170 Bilateral primary osteoarthritis of knee: Secondary | ICD-10-CM | POA: Diagnosis not present

## 2014-10-30 DIAGNOSIS — M1712 Unilateral primary osteoarthritis, left knee: Secondary | ICD-10-CM | POA: Diagnosis not present

## 2014-10-30 DIAGNOSIS — M5136 Other intervertebral disc degeneration, lumbar region: Secondary | ICD-10-CM | POA: Diagnosis not present

## 2014-11-07 DIAGNOSIS — Z23 Encounter for immunization: Secondary | ICD-10-CM | POA: Diagnosis not present

## 2014-11-11 ENCOUNTER — Ambulatory Visit
Admission: RE | Admit: 2014-11-11 | Discharge: 2014-11-11 | Disposition: A | Discharge status: Home / Self Care | Payer: Medicare Other | Source: Ambulatory Visit | Attending: Endocrinology | Admitting: Endocrinology

## 2014-11-11 DIAGNOSIS — E042 Nontoxic multinodular goiter: Secondary | ICD-10-CM | POA: Diagnosis not present

## 2014-11-11 DIAGNOSIS — E041 Nontoxic single thyroid nodule: Secondary | ICD-10-CM

## 2014-11-13 ENCOUNTER — Other Ambulatory Visit: Payer: PRIVATE HEALTH INSURANCE

## 2014-11-21 DIAGNOSIS — E041 Nontoxic single thyroid nodule: Secondary | ICD-10-CM | POA: Diagnosis not present

## 2014-11-21 DIAGNOSIS — Z23 Encounter for immunization: Secondary | ICD-10-CM | POA: Diagnosis not present

## 2014-11-21 DIAGNOSIS — I10 Essential (primary) hypertension: Secondary | ICD-10-CM | POA: Diagnosis not present

## 2015-02-05 ENCOUNTER — Other Ambulatory Visit: Payer: Self-pay | Admitting: Family

## 2015-02-05 MED ORDER — LISINOPRIL 5 MG PO TABS: 5.0000 mg | ORAL_TABLET | Freq: Every day | ORAL | Status: DC

## 2015-02-10 ENCOUNTER — Ambulatory Visit (INDEPENDENT_AMBULATORY_CARE_PROVIDER_SITE_OTHER): Payer: Medicare Other | Admitting: Family

## 2015-02-10 ENCOUNTER — Encounter: Payer: Self-pay | Admitting: Family

## 2015-02-10 VITALS — BP 138/86 | HR 94 | Temp 97.8°F | Resp 15 | Ht 66.5 in | Wt 149.8 lb

## 2015-02-10 DIAGNOSIS — I10 Essential (primary) hypertension: Secondary | ICD-10-CM

## 2015-02-10 MED ORDER — AMLODIPINE BESYLATE 5 MG PO TABS: ORAL_TABLET | ORAL | Status: DC

## 2015-02-10 MED ORDER — LISINOPRIL 5 MG PO TABS: 5.0000 mg | ORAL_TABLET | Freq: Every day | ORAL | Status: DC

## 2015-02-10 NOTE — Patient Instructions (Signed)
Thank you for choosing [facility].  Summary/Instructions:  Your prescription(s) have been submitted to your pharmacy or been printed and provided for you. Please take as directed and contact our office if you believe you are having problem(s) with the medication(s) or have any questions.  Please continue to take you medications as prescribed.  Continue to monitor your blood pressure at home.

## 2015-02-10 NOTE — Assessment & Plan Note (Signed)
Hypertension is well controlled with current regimen and below goal 140/90 both in office and in home readings. Denies adverse side effects or symptoms of end organ damage. Continue to monitor blood pressure at home. Continue current dosage of lisinopril and amlodipine. Follow-up in 6 months or sooner if needed.

## 2015-02-10 NOTE — Progress Notes (Signed)
   Subjective:    Patient ID: Summer Rodriguez, female    DOB: [birth year], 82 y.o.   MRN: [id number]  Chief Complaint  Patient presents with  . Hypertension    Follow up and refills.     HPI:  Summer Rodriguez is a 82 y.o. female who  has a past medical history of Hypertension; Allergy; Arthritis; Urine incontinence; Hyperlipemia; Arthritis; Basal cell carcinoma; and Osteopenia (04/2013). and presents today for a follow up office visit.    1.) Hypertension - Currently taking lisinopril and amlodipine. Reports taking the medication as prescribed and denies adverse side effects or hypotensive readings. Does not currently exercise.  Takes her blood pressure at home with an average of 130s/80s. Denies symptoms of end organ damage. Positive for headaches associated with allergies/sinus symptoms.  BP Readings from Last 3 Encounters:  02/10/15 138/86  03/06/14 150/88  12/31/13 128/82     Allergies  Allergen Reactions  . Penicillins Hives and Shortness Of Breath  . Sulfur Hives and Shortness Of Breath  . Codeine Other (See Comments)    "Spaced out" / "Altered reality"     Current Outpatient Prescriptions on File Prior to Visit  Medication Sig Dispense Refill  . Glycerin-Hypromellose-PEG 400 (HM DRY EYE RELIEF) 0.2-0.2-1 % SOLN Apply to eye as needed.    . naproxen sodium (ALEVE) 220 MG tablet Take 220 mg by mouth as needed.     No current facility-administered medications on file prior to visit.    Review of Systems  Eyes:       Negative for changes in vision.  Respiratory: Negative for chest tightness and shortness of breath.   Cardiovascular: Negative for chest pain, palpitations and leg swelling.  Neurological: Positive for headaches.      Objective:    BP 138/86 mmHg  Pulse 94  Temp(Src) 97.8 F (36.6 C) (Oral)  Resp 15  Ht 5' 6.5" (1.689 m)  Wt 149 lb 12.8 oz (67.949 kg)  BMI 23.82 kg/m2  SpO2 98% Nursing note and vital signs reviewed.  Physical Exam    Constitutional: She is oriented to person, place, and time. She appears well-developed and well-nourished. No distress.  Cardiovascular: Normal rate, regular rhythm, normal heart sounds and intact distal pulses.   Pulmonary/Chest: Effort normal and breath sounds normal.  Neurological: She is alert and oriented to person, place, and time.  Skin: Skin is warm and dry.  Psychiatric: She has a normal mood and affect. Her behavior is normal. Judgment and thought content normal.       Assessment & Plan:   Problem List Items Addressed This Visit      Cardiovascular and Mediastinum   Essential hypertension, benign - Primary    Hypertension is well controlled with current regimen and below goal 140/90 both in office and in home readings. Denies adverse side effects or symptoms of end organ damage. Continue to monitor blood pressure at home. Continue current dosage of lisinopril and amlodipine. Follow-up in 6 months or sooner if needed.      Relevant Medications   amLODipine (NORVASC) 5 MG tablet   lisinopril (PRINIVIL,ZESTRIL) 5 MG tablet

## 2015-02-10 NOTE — Progress Notes (Signed)
Pre visit review using our clinic review tool, if applicable. No additional management support is needed unless otherwise documented below in the visit note. 

## 2015-03-05 ENCOUNTER — Ambulatory Visit (INDEPENDENT_AMBULATORY_CARE_PROVIDER_SITE_OTHER): Payer: Medicare Other | Admitting: Ophthalmology

## 2015-03-05 DIAGNOSIS — H353134 Nonexudative age-related macular degeneration, bilateral, advanced atrophic with subfoveal involvement: Secondary | ICD-10-CM | POA: Diagnosis not present

## 2015-03-05 DIAGNOSIS — D3132 Benign neoplasm of left choroid: Secondary | ICD-10-CM

## 2015-03-05 DIAGNOSIS — H43813 Vitreous degeneration, bilateral: Secondary | ICD-10-CM | POA: Diagnosis not present

## 2015-03-05 DIAGNOSIS — I10 Essential (primary) hypertension: Secondary | ICD-10-CM

## 2015-03-05 DIAGNOSIS — H35033 Hypertensive retinopathy, bilateral: Secondary | ICD-10-CM | POA: Diagnosis not present

## 2015-03-05 DIAGNOSIS — H33301 Unspecified retinal break, right eye: Secondary | ICD-10-CM | POA: Diagnosis not present

## 2015-05-07 DIAGNOSIS — M5136 Other intervertebral disc degeneration, lumbar region: Secondary | ICD-10-CM | POA: Diagnosis not present

## 2015-05-07 DIAGNOSIS — M1711 Unilateral primary osteoarthritis, right knee: Secondary | ICD-10-CM | POA: Diagnosis not present

## 2015-05-07 DIAGNOSIS — M170 Bilateral primary osteoarthritis of knee: Secondary | ICD-10-CM | POA: Diagnosis not present

## 2015-05-07 DIAGNOSIS — M545 Low back pain: Secondary | ICD-10-CM | POA: Diagnosis not present

## 2015-05-07 DIAGNOSIS — M1712 Unilateral primary osteoarthritis, left knee: Secondary | ICD-10-CM | POA: Diagnosis not present

## 2015-05-15 DIAGNOSIS — E041 Nontoxic single thyroid nodule: Secondary | ICD-10-CM | POA: Diagnosis not present

## 2015-05-22 DIAGNOSIS — E041 Nontoxic single thyroid nodule: Secondary | ICD-10-CM | POA: Diagnosis not present

## 2015-05-26 DIAGNOSIS — M810 Age-related osteoporosis without current pathological fracture: Secondary | ICD-10-CM

## 2015-05-26 HISTORY — DX: Age-related osteoporosis without current pathological fracture: M81.0

## 2015-05-28 ENCOUNTER — Other Ambulatory Visit: Payer: Self-pay | Admitting: Endocrinology

## 2015-05-28 DIAGNOSIS — E041 Nontoxic single thyroid nodule: Secondary | ICD-10-CM

## 2015-06-05 DIAGNOSIS — L603 Nail dystrophy: Secondary | ICD-10-CM | POA: Diagnosis not present

## 2015-06-05 DIAGNOSIS — I739 Peripheral vascular disease, unspecified: Secondary | ICD-10-CM | POA: Diagnosis not present

## 2015-06-12 DIAGNOSIS — M85852 Other specified disorders of bone density and structure, left thigh: Secondary | ICD-10-CM | POA: Diagnosis not present

## 2015-06-12 DIAGNOSIS — M810 Age-related osteoporosis without current pathological fracture: Secondary | ICD-10-CM | POA: Diagnosis not present

## 2015-06-12 DIAGNOSIS — M85851 Other specified disorders of bone density and structure, right thigh: Secondary | ICD-10-CM | POA: Diagnosis not present

## 2015-06-12 DIAGNOSIS — Z1231 Encounter for screening mammogram for malignant neoplasm of breast: Secondary | ICD-10-CM | POA: Diagnosis not present

## 2015-06-18 ENCOUNTER — Encounter: Payer: Self-pay | Admitting: Gynecology

## 2015-06-20 ENCOUNTER — Encounter: Payer: Self-pay | Admitting: Gynecology

## 2015-06-24 ENCOUNTER — Encounter: Payer: Self-pay | Admitting: Gynecology

## 2015-06-24 ENCOUNTER — Telehealth: Payer: Self-pay | Admitting: Gynecology

## 2015-06-24 NOTE — Telephone Encounter (Signed)
Tell patient her bone density shows osteoporosis. She should seriously consider taking medication for this prevent fracture. If she is interested then office visit to discuss with me or with herrimary physician.

## 2015-06-25 NOTE — Telephone Encounter (Signed)
Pt informed with the below note. 

## 2015-07-22 ENCOUNTER — Telehealth: Payer: Self-pay | Admitting: *Deleted

## 2015-07-22 NOTE — Telephone Encounter (Signed)
Please forward to Dr. Phineas Real. It is his patient. Thanks JF

## 2015-07-22 NOTE — Telephone Encounter (Signed)
See my 06/24/2015 telephone  note that you informed the patient with

## 2015-07-22 NOTE — Telephone Encounter (Signed)
Dr.Fontaine see below

## 2015-07-22 NOTE — Telephone Encounter (Signed)
Pt called requesting Dexa results from [facility].

## 2015-07-23 NOTE — Telephone Encounter (Signed)
Pt daughter Summer Rodriguez has DPR access asked for copy of recent dexa report, I left message on her voicemail that medical release can be filled out.

## 2015-08-25 DIAGNOSIS — I739 Peripheral vascular disease, unspecified: Secondary | ICD-10-CM | POA: Diagnosis not present

## 2015-08-25 DIAGNOSIS — L603 Nail dystrophy: Secondary | ICD-10-CM | POA: Diagnosis not present

## 2015-08-27 DIAGNOSIS — M545 Low back pain: Secondary | ICD-10-CM | POA: Diagnosis not present

## 2015-08-27 DIAGNOSIS — M5136 Other intervertebral disc degeneration, lumbar region: Secondary | ICD-10-CM | POA: Diagnosis not present

## 2015-08-27 DIAGNOSIS — G8929 Other chronic pain: Secondary | ICD-10-CM | POA: Diagnosis not present

## 2015-08-27 DIAGNOSIS — M25561 Pain in right knee: Secondary | ICD-10-CM | POA: Diagnosis not present

## 2015-08-27 DIAGNOSIS — M25562 Pain in left knee: Secondary | ICD-10-CM | POA: Diagnosis not present

## 2015-09-10 DIAGNOSIS — H524 Presbyopia: Secondary | ICD-10-CM | POA: Diagnosis not present

## 2015-09-10 DIAGNOSIS — H3122 Choroidal dystrophy (central areolar) (generalized) (peripapillary): Secondary | ICD-10-CM | POA: Diagnosis not present

## 2015-09-10 DIAGNOSIS — H52223 Regular astigmatism, bilateral: Secondary | ICD-10-CM | POA: Diagnosis not present

## 2015-09-10 DIAGNOSIS — H5202 Hypermetropia, left eye: Secondary | ICD-10-CM | POA: Diagnosis not present

## 2015-10-04 ENCOUNTER — Other Ambulatory Visit: Payer: Self-pay | Admitting: Family

## 2015-10-04 DIAGNOSIS — I10 Essential (primary) hypertension: Secondary | ICD-10-CM

## 2015-10-06 ENCOUNTER — Ambulatory Visit
Admission: RE | Admit: 2015-10-06 | Discharge: 2015-10-06 | Disposition: A | Discharge status: Home / Self Care | Payer: Medicare Other | Source: Ambulatory Visit | Attending: Endocrinology | Admitting: Endocrinology

## 2015-10-06 DIAGNOSIS — E042 Nontoxic multinodular goiter: Secondary | ICD-10-CM | POA: Diagnosis not present

## 2015-10-06 DIAGNOSIS — E041 Nontoxic single thyroid nodule: Secondary | ICD-10-CM

## 2015-10-21 DIAGNOSIS — K5900 Constipation, unspecified: Secondary | ICD-10-CM | POA: Diagnosis not present

## 2015-10-21 DIAGNOSIS — I10 Essential (primary) hypertension: Secondary | ICD-10-CM | POA: Diagnosis not present

## 2015-10-21 DIAGNOSIS — E041 Nontoxic single thyroid nodule: Secondary | ICD-10-CM | POA: Diagnosis not present

## 2015-10-23 DIAGNOSIS — E7800 Pure hypercholesterolemia, unspecified: Secondary | ICD-10-CM | POA: Diagnosis not present

## 2015-10-23 DIAGNOSIS — Z1321 Encounter for screening for nutritional disorder: Secondary | ICD-10-CM | POA: Diagnosis not present

## 2015-10-23 DIAGNOSIS — N390 Urinary tract infection, site not specified: Secondary | ICD-10-CM | POA: Diagnosis not present

## 2015-10-23 DIAGNOSIS — E559 Vitamin D deficiency, unspecified: Secondary | ICD-10-CM | POA: Diagnosis not present

## 2015-10-23 LAB — LIPID PANEL
Cholesterol: 194 mg/dL (ref 0–200)
HDL: 84 mg/dL — AB (ref 35–70)
TRIGLYCERIDES: 83 mg/dL (ref 40–160)

## 2015-10-23 LAB — BASIC METABOLIC PANEL
BUN: 14 mg/dL (ref 4–21)
CREATININE: 1.2 mg/dL — AB (ref <=1.1)
Glucose: 97 mg/dL
POTASSIUM: 4.6 mmol/L (ref 3.4–5.3)
Sodium: 142 mmol/L (ref 137–147)

## 2015-10-23 LAB — HEPATIC FUNCTION PANEL
ALT: 22 U/L (ref 7–35)
AST: 16 U/L (ref 13–35)
Alkaline Phosphatase: 80 U/L (ref 25–125)
Bilirubin, Total: 0.5 mg/dL

## 2015-10-27 DIAGNOSIS — M810 Age-related osteoporosis without current pathological fracture: Secondary | ICD-10-CM | POA: Diagnosis not present

## 2015-11-03 ENCOUNTER — Other Ambulatory Visit: Payer: Medicare Other

## 2015-11-27 ENCOUNTER — Other Ambulatory Visit: Payer: Self-pay | Admitting: Endocrinology

## 2015-11-27 DIAGNOSIS — E041 Nontoxic single thyroid nodule: Secondary | ICD-10-CM

## 2015-12-02 ENCOUNTER — Ambulatory Visit (INDEPENDENT_AMBULATORY_CARE_PROVIDER_SITE_OTHER): Payer: Medicare Other | Admitting: Family

## 2015-12-02 ENCOUNTER — Encounter: Payer: Self-pay | Admitting: Family

## 2015-12-02 VITALS — BP 132/80 | HR 100 | Temp 97.6°F | Resp 16 | Ht 66.5 in | Wt 146.0 lb

## 2015-12-02 DIAGNOSIS — Z0000 Encounter for general adult medical examination without abnormal findings: Secondary | ICD-10-CM

## 2015-12-02 DIAGNOSIS — I10 Essential (primary) hypertension: Secondary | ICD-10-CM

## 2015-12-02 DIAGNOSIS — E785 Hyperlipidemia, unspecified: Secondary | ICD-10-CM | POA: Insufficient documentation

## 2015-12-02 DIAGNOSIS — E782 Mixed hyperlipidemia: Secondary | ICD-10-CM | POA: Diagnosis not present

## 2015-12-02 MED ORDER — LISINOPRIL 5 MG PO TABS: 5.0000 mg | ORAL_TABLET | Freq: Every day | ORAL | 2 refills | Status: DC

## 2015-12-02 NOTE — Patient Instructions (Addendum)
Thank you for choosing [facility].  SUMMARY AND INSTRUCTIONS:  Medication:  Continue to take your medications as prescribed.   Your prescription(s) have been submitted to your pharmacy or been printed and provided for you. Please take as directed and contact our office if you believe you are having problem(s) with the medication(s) or have any questions.  Follow up:  If your symptoms worsen or fail to improve, please contact our office for further instruction, or in case of emergency go directly to the emergency room at the closest medical facility.    Health Maintenance  Topic Date Due  . ZOSTAVAX  11/25/2016 (Originally 01/23/1984)  . TETANUS/TDAP  01/25/2017  . INFLUENZA VACCINE  Addressed  . DEXA SCAN  Completed  . PNA vac Low Risk Adult  Completed   Health Maintenance, Female Adopting a healthy lifestyle and getting preventive care can go a long way to promote health and wellness. Talk with your health care provider about what schedule of regular examinations is right for you. This is a good chance for you to check in with your provider about disease prevention and staying healthy. In between checkups, there are plenty of things you can do on your own. Experts have done a lot of research about which lifestyle changes and preventive measures are most likely to keep you healthy. Ask your health care provider for more information. WEIGHT AND DIET  Eat a healthy diet  Be sure to include plenty of vegetables, fruits, low-fat dairy products, and lean protein.  Do not eat a lot of foods high in solid fats, added sugars, or salt.  Get regular exercise. This is one of the most important things you can do for your health.  Most adults should exercise for at least 150 minutes each week. The exercise should increase your heart rate and make you sweat (moderate-intensity exercise).  Most adults should also do strengthening exercises at least twice a week. This is in addition to  the moderate-intensity exercise.  Maintain a healthy weight  Body mass index (BMI) is a measurement that can be used to identify possible weight problems. It estimates body fat based on height and weight. Your health care provider can help determine your BMI and help you achieve or maintain a healthy weight.  For females 62 years of age and older:   A BMI below 18.5 is considered underweight.  A BMI of 18.5 to 24.9 is normal.  A BMI of 25 to 29.9 is considered overweight.  A BMI of 30 and above is considered obese.  Watch levels of cholesterol and blood lipids  You should start having your blood tested for lipids and cholesterol at 80 years of age, then have this test every 5 years.  You may need to have your cholesterol levels checked more often if:  Your lipid or cholesterol levels are high.  You are older than 80 years of age.  You are at high risk for heart disease.  CANCER SCREENING   Lung Cancer  Lung cancer screening is recommended for adults 18-51 years old who are at high risk for lung cancer because of a history of smoking.  A yearly low-dose CT scan of the lungs is recommended for people who:  Currently smoke.  Have quit within the past 15 years.  Have at least a 30-pack-year history of smoking. A pack year is smoking an average of one pack of cigarettes a day for 1 year.  Yearly screening should continue until it has been  15 years since you quit.  Yearly screening should stop if you develop a health problem that would prevent you from having lung cancer treatment.  Breast Cancer  Practice breast self-awareness. This means understanding how your breasts normally appear and feel.  It also means doing regular breast self-exams. Let your health care provider know about any changes, no matter how small.  If you are in your 20s or 30s, you should have a clinical breast exam (CBE) by a health care provider every 1-3 years as part of a regular health  exam.  If you are 74 or older, have a CBE every year. Also consider having a breast X-ray (mammogram) every year.  If you have a family history of breast cancer, talk to your health care provider about genetic screening.  If you are at high risk for breast cancer, talk to your health care provider about having an MRI and a mammogram every year.  Breast cancer gene (BRCA) assessment is recommended for women who have family members with BRCA-related cancers. BRCA-related cancers include:  Breast.  Ovarian.  Tubal.  Peritoneal cancers.  Results of the assessment will determine the need for genetic counseling and BRCA1 and BRCA2 testing. Cervical Cancer Your health care provider may recommend that you be screened regularly for cancer of the pelvic organs (ovaries, uterus, and vagina). This screening involves a pelvic examination, including checking for microscopic changes to the surface of your cervix (Pap test). You may be encouraged to have this screening done every 3 years, beginning at age 34.  For women ages 13-65, health care providers may recommend pelvic exams and Pap testing every 3 years, or they may recommend the Pap and pelvic exam, combined with testing for human papilloma virus (HPV), every 5 years. Some types of HPV increase your risk of cervical cancer. Testing for HPV may also be done on women of any age with unclear Pap test results.  Other health care providers may not recommend any screening for nonpregnant women who are considered low risk for pelvic cancer and who do not have symptoms. Ask your health care provider if a screening pelvic exam is right for you.  If you have had past treatment for cervical cancer or a condition that could lead to cancer, you need Pap tests and screening for cancer for at least 20 years after your treatment. If Pap tests have been discontinued, your risk factors (such as having a new sexual partner) need to be reassessed to determine if  screening should resume. Some women have medical problems that increase the chance of getting cervical cancer. In these cases, your health care provider may recommend more frequent screening and Pap tests. Colorectal Cancer  This type of cancer can be detected and often prevented.  Routine colorectal cancer screening usually begins at 80 years of age and continues through 80 years of age.  Your health care provider may recommend screening at an earlier age if you have risk factors for colon cancer.  Your health care provider may also recommend using home test kits to check for hidden blood in the stool.  A small camera at the end of a tube can be used to examine your colon directly (sigmoidoscopy or colonoscopy). This is done to check for the earliest forms of colorectal cancer.  Routine screening usually begins at age 66.  Direct examination of the colon should be repeated every 5-10 years through 80 years of age. However, you may need to be screened more often if  early forms of precancerous polyps or small growths are found. Skin Cancer  Check your skin from head to toe regularly.  Tell your health care provider about any new moles or changes in moles, especially if there is a change in a mole's shape or color.  Also tell your health care provider if you have a mole that is larger than the size of a pencil eraser.  Always use sunscreen. Apply sunscreen liberally and repeatedly throughout the day.  Protect yourself by wearing long sleeves, pants, a wide-brimmed hat, and sunglasses whenever you are outside. HEART DISEASE, DIABETES, AND HIGH BLOOD PRESSURE   High blood pressure causes heart disease and increases the risk of stroke. High blood pressure is more likely to develop in:  People who have blood pressure in the high end of the normal range (130-139/85-89 mm Hg).  People who are overweight or obese.  People who are African American.  If you are 21-11 years of age, have your  blood pressure checked every 3-5 years. If you are 63 years of age or older, have your blood pressure checked every year. You should have your blood pressure measured twice--once when you are at a hospital or clinic, and once when you are not at a hospital or clinic. Record the average of the two measurements. To check your blood pressure when you are not at a hospital or clinic, you can use:  An automated blood pressure machine at a pharmacy.  A home blood pressure monitor.  If you are between 52 years and 23 years old, ask your health care provider if you should take aspirin to prevent strokes.  Have regular diabetes screenings. This involves taking a blood sample to check your fasting blood sugar level.  If you are at a normal weight and have a low risk for diabetes, have this test once every three years after 80 years of age.  If you are overweight and have a high risk for diabetes, consider being tested at a younger age or more often. PREVENTING INFECTION  Hepatitis B  If you have a higher risk for hepatitis B, you should be screened for this virus. You are considered at high risk for hepatitis B if:  You were born in a country where hepatitis B is common. Ask your health care provider which countries are considered high risk.  Your parents were born in a high-risk country, and you have not been immunized against hepatitis B (hepatitis B vaccine).  You have HIV or AIDS.  You use needles to inject street drugs.  You live with someone who has hepatitis B.  You have had sex with someone who has hepatitis B.  You get hemodialysis treatment.  You take certain medicines for conditions, including cancer, organ transplantation, and autoimmune conditions. Hepatitis C  Blood testing is recommended for:  Everyone born from [birth year] through 1963.  Anyone with known risk factors for hepatitis C. Sexually transmitted infections (STIs)  You should be screened for sexually transmitted  infections (STIs) including gonorrhea and chlamydia if:  You are sexually active and are younger than 80 years of age.  You are older than 80 years of age and your health care provider tells you that you are at risk for this type of infection.  Your sexual activity has changed since you were last screened and you are at an increased risk for chlamydia or gonorrhea. Ask your health care provider if you are at risk.  If you do not have HIV, but  are at risk, it may be recommended that you take a prescription medicine daily to prevent HIV infection. This is called pre-exposure prophylaxis (PrEP). You are considered at risk if:  You are sexually active and do not regularly use condoms or know the HIV status of your partner(s).  You take drugs by injection.  You are sexually active with a partner who has HIV. Talk with your health care provider about whether you are at high risk of being infected with HIV. If you choose to begin PrEP, you should first be tested for HIV. You should then be tested every 3 months for as long as you are taking PrEP.  PREGNANCY   If you are premenopausal and you may become pregnant, ask your health care provider about preconception counseling.  If you may become pregnant, take 400 to 800 micrograms (mcg) of folic acid every day.  If you want to prevent pregnancy, talk to your health care provider about birth control (contraception). OSTEOPOROSIS AND MENOPAUSE   Osteoporosis is a disease in which the bones lose minerals and strength with aging. This can result in serious bone fractures. Your risk for osteoporosis can be identified using a bone density scan.  If you are 52 years of age or older, or if you are at risk for osteoporosis and fractures, ask your health care provider if you should be screened.  Ask your health care provider whether you should take a calcium or vitamin D supplement to lower your risk for osteoporosis.  Menopause may have certain physical  symptoms and risks.  Hormone replacement therapy may reduce some of these symptoms and risks. Talk to your health care provider about whether hormone replacement therapy is right for you.  HOME CARE INSTRUCTIONS   Schedule regular health, dental, and eye exams.  Stay current with your immunizations.   Do not use any tobacco products including cigarettes, chewing tobacco, or electronic cigarettes.  If you are pregnant, do not drink alcohol.  If you are breastfeeding, limit how much and how often you drink alcohol.  Limit alcohol intake to no more than 1 drink per day for nonpregnant women. One drink equals 12 ounces of beer, 5 ounces of wine, or 1 ounces of hard liquor.  Do not use street drugs.  Do not share needles.  Ask your health care provider for help if you need support or information about quitting drugs.  Tell your health care provider if you often feel depressed.  Tell your health care provider if you have ever been abused or do not feel safe at home.   This information is not intended to replace advice given to you by your health care provider. Make sure you discuss any questions you have with your health care provider.   Document Released: 07/27/2010 Document Revised: 02/01/2014 Document Reviewed: 12/13/2012 Elsevier Interactive Patient Education [employer].

## 2015-12-02 NOTE — Assessment & Plan Note (Signed)
Blood pressures below goal 150/90 with current regimen and no per side effects. Continue current dosage of lisinopril and amlodipine. Encouraged to monitor blood pressure at home and continue following low-sodium diet.

## 2015-12-02 NOTE — Assessment & Plan Note (Signed)
Hyperlipidemia appears adequately controlled with lifestyle management. Continue to monitor.

## 2015-12-02 NOTE — Assessment & Plan Note (Addendum)
Reviewed and updated patient's medical, surgical, family and social history. Medications and allergies were also reviewed. Basic screenings for depression, activities of daily living, hearing, cognition and safety were performed. Provider list was updated and health plan was provided to the patient.   1) Anticipatory Guidance: Discussed importance of wearing a seatbelt while driving and not texting while driving; changing batteries in smoke detector at least once annually; wearing suntan lotion when outside; eating a balanced and moderate diet; getting physical activity at least 30 minutes per day.  Declines Zostavax. All other immunizations are up-to-date per recommendations. All screenings are up-to-date per recommendations. Patient does request carotid artery Doppler, however following discussion the risk is minimal at this time with no bruits noted upon exam. Continue to monitor. Previous blood work reviewed with no significant abnormalities. Continue healthy lifestyle behaviors and choices. Follow-up prevention exam in 1 year. Follow-up office visit chronic conditions as needed.

## 2015-12-02 NOTE — Progress Notes (Signed)
Subjective:    Patient ID: Summer Rodriguez, female    DOB: 1921/04/19, 82 y.o.   MRN: [id number]  Chief Complaint  Patient presents with  . Follow-up    refill of BP medications, wants a carotid artery scan    HPI:  Summer Rodriguez is a 82 y.o. female who presents today for a Medicare Annual Wellness/Physical exam.    1) Health Maintenance -   Diet - Averages about 3 meals per day consisting of a regular diet; Caffeine intake of about 1-2 cups per day.   Exercise - Walks around regularly.   2) Preventative Exams / Immunizations:  Dental -- Up to date  Vision -- Up to date   Health Maintenance  Topic Date Due  . ZOSTAVAX  11/25/2016 (Originally 01/23/1984)  . TETANUS/TDAP  01/25/2017  . INFLUENZA VACCINE  Addressed  . DEXA SCAN  Completed  . PNA vac Low Risk Adult  Completed     Immunization History  Administered Date(s) Administered  . Pneumococcal-Unspecified 01/25/2010  . Td 01/26/2007    RISK FACTORS  Tobacco History  Smoking Status  . Never Smoker  Smokeless Tobacco  . Never Used     Cardiac risk factors: advanced age (older than 47 for men, 23 for women), dyslipidemia and hypertension.  Depression Screen  Depression screen PHQ 2/9 12/02/2015  Decreased Interest 0  Down, Depressed, Hopeless 0  PHQ - 2 Score 0     Activities of Daily Living In your present state of health, do you have any difficulty performing the following activities?:  Driving? No Managing money?  No Feeding yourself? No Getting from bed to chair? No Climbing a flight of stairs? No Preparing food and eating?: No Bathing or showering? No Getting dressed: No Getting to the toilet? No Using the toilet: No Moving around from place to place: No In the past year have you fallen or had a near fall?:No   Home Safety Has smoke detector and wears seat belts. No firearms. No excess sun exposure. Are there smokers in your home (other than you)?  No Do you feel safe at home?   Yes  Hearing Difficulties: No Do you often ask people to speak up or repeat themselves? No Do you experience ringing or noises in your ears? No  Do you have difficulty understanding soft or whispered voices? No    Cognitive Testing  Alert? Yes   Normal Appearance? Yes  Oriented to person? Yes  Place? Yes   Time? Yes  Recall of three objects?  Yes  Can perform simple calculations? Yes  Displays appropriate judgment? Yes  Can read the correct time from a watch face? Yes  Do you feel that you have a problem with memory? No  Do you often misplace items? No   Advanced Directives have been discussed with the patient? Yes   Current Physicians/Providers and Suppliers  1. Terri Piedra, FNP - Internal Medicine 2. Dr. Wilson Singer - Endocrinology  Indicate any recent Medical Services you may have received from other than [facility] providers in the past year (date may be approximate).  All answers were reviewed with the patient and necessary referrals were made:  Mauricio Po, FNP   12/02/2015    Allergies  Allergen Reactions  . Penicillins Hives and Shortness Of Breath  . Sulfur Hives and Shortness Of Breath  . Codeine Other (See Comments)    "Spaced out" / "Altered reality"     Outpatient Medications Prior to Visit  Medication  Sig Dispense Refill  . amLODipine (NORVASC) 5 MG tablet TAKE ONE TABLET BY MOUTH ONCE DAILY 90 tablet 2  . naproxen sodium (ALEVE) 220 MG tablet Take 220 mg by mouth as needed.    . Glycerin-Hypromellose-PEG 400 (HM DRY EYE RELIEF) 0.2-0.2-1 % SOLN Apply to eye as needed.    Marland Kitchen lisinopril (PRINIVIL,ZESTRIL) 5 MG tablet Take 1 tablet (5 mg total) by mouth daily. 90 tablet 2   No facility-administered medications prior to visit.      Past Medical History:  Diagnosis Date  . Allergy   . Arthritis   . Arthritis   . Basal cell carcinoma    on nose  . Hyperlipemia   . Hypertension   . Osteoporosis 05/2015   T score -3.2  . Urine incontinence      Past  Surgical History:  Procedure Laterality Date  . BASAL CELL CARCINOMA EXCISION     under nose  . BREAST SURGERY  1977   Bilateral partial mastectomy-no breast cancer  . CHOLECYSTECTOMY    . OVARIAN CYST SURGERY       Family History  Problem Relation Age of Onset  . Ovarian cancer Mother   . Cancer Mother     Ovarian/Uterine Cancer  . Thyroid disease Daughter   . Diabetes Neg Hx   . Stroke Neg Hx   . Colon cancer Neg Hx   . Rectal cancer Neg Hx   . Stomach cancer Neg Hx      Social History   Social History  . Marital status: Widowed    Spouse name: N/A  . Number of children: 2  . Years of education: 14   Occupational History  . Retired    Social History Main Topics  . Smoking status: Never Smoker  . Smokeless tobacco: Never Used  . Alcohol use No     Comment: Rare  . Drug use: No  . Sexual activity: No   Other Topics Concern  . Not on file   Social History Narrative   Regular exercise-no   Caffeine Use-yes     Review of Systems  Constitutional: Denies fever, chills, fatigue, or significant weight gain/loss. HENT: Head: Denies headache or neck pain Ears: Denies changes in hearing, ringing in ears, earache, drainage Nose: Denies discharge, stuffiness, itching, nosebleed, sinus pain Throat: Denies sore throat, hoarseness, dry mouth, sores, thrush Eyes: Denies loss/changes in vision, pain, redness, blurry/double vision, flashing lights Cardiovascular: Denies chest pain/discomfort, tightness, palpitations, shortness of breath with activity, difficulty lying down, swelling, sudden awakening with shortness of breath Respiratory: Denies shortness of breath, cough, sputum production, wheezing Gastrointestinal: Denies dysphasia, heartburn, change in appetite, nausea, change in bowel habits, rectal bleeding, constipation, diarrhea, yellow skin or eyes Genitourinary: Denies frequency, urgency, burning/pain, blood in urine, incontinence, change in urinary  strength. Musculoskeletal: Denies muscle/joint pain, stiffness, back pain, redness or swelling of joints, trauma Skin: Denies rashes, lumps, itching, dryness, color changes, or hair/nail changes Neurological: Denies dizziness, fainting, seizures, weakness, numbness, tingling, tremor Psychiatric - Denies nervousness, stress, depression or memory loss Endocrine: Denies heat or cold intolerance, sweating, frequent urination, excessive thirst, changes in appetite Hematologic: Denies ease of bruising or bleeding    Objective:     BP 132/80 (BP Location: Left Arm, Patient Position: Sitting, Cuff Size: Normal)   Pulse 100   Temp 97.6 F (36.4 C) (Oral)   Resp 16   Ht 5' 6.5" (1.689 m)   Wt 146 lb (66.2 kg)   SpO2 95%  BMI 23.21 kg/m  Nursing note and vital signs reviewed.  Physical Exam  Constitutional: She is oriented to person, place, and time. She appears well-developed and well-nourished. No distress.  Cardiovascular: Normal rate, regular rhythm, normal heart sounds and intact distal pulses.   Pulmonary/Chest: Effort normal and breath sounds normal.  Neurological: She is alert and oriented to person, place, and time.  Skin: Skin is warm and dry.  Psychiatric: She has a normal mood and affect. Her behavior is normal. Judgment and thought content normal.       Assessment & Plan:   During the course of the visit the patient was educated and counseled about appropriate screening and preventive services including:    Pneumococcal vaccine   Influenza vaccine  Nutrition counseling   Diet review for nutrition referral? Yes ____  Not Indicated _X___   Patient Instructions (the written plan) was given to the patient.  Medicare Attestation I have personally reviewed: The patient's medical and social history Their use of alcohol, tobacco or illicit drugs Their current medications and supplements The patient's functional ability including ADLs,fall risks, home safety risks,  cognitive, and hearing and visual impairment Diet and physical activities Evidence for depression or mood disorders  The patient's weight, height, BMI,  have been recorded in the chart.  I have made referrals, counseling, and provided education to the patient based on review of the above and I have provided the patient with a written personalized care plan for preventive services.     Problem List Items Addressed This Visit      Cardiovascular and Mediastinum   Essential hypertension, benign    Blood pressures below goal 150/90 with current regimen and no per side effects. Continue current dosage of lisinopril and amlodipine. Encouraged to monitor blood pressure at home and continue following low-sodium diet.      Relevant Medications   lisinopril (PRINIVIL,ZESTRIL) 5 MG tablet     Other   Encounter for Medicare annual wellness exam - Primary    Reviewed and updated patient's medical, surgical, family and social history. Medications and allergies were also reviewed. Basic screenings for depression, activities of daily living, hearing, cognition and safety were performed. Provider list was updated and health plan was provided to the patient.   1) Anticipatory Guidance: Discussed importance of wearing a seatbelt while driving and not texting while driving; changing batteries in smoke detector at least once annually; wearing suntan lotion when outside; eating a balanced and moderate diet; getting physical activity at least 30 minutes per day.  Declines Zostavax. All other immunizations are up-to-date per recommendations. All screenings are up-to-date per recommendations. Patient does request carotid artery Doppler, however following discussion the risk is minimal at this time with no bruits noted upon exam. Continue to monitor. Previous blood work reviewed with no significant abnormalities. Continue healthy lifestyle behaviors and choices. Follow-up prevention exam in 1 year. Follow-up office  visit chronic conditions as needed.      Hyperlipidemia    Hyperlipidemia appears adequately controlled with lifestyle management. Continue to monitor.      Relevant Medications   lisinopril (PRINIVIL,ZESTRIL) 5 MG tablet       I have discontinued Summer Rodriguez's Glycerin-Hypromellose-PEG 400. I am also having her maintain her naproxen sodium, amLODipine, and lisinopril.   Meds ordered this encounter  Medications  . lisinopril (PRINIVIL,ZESTRIL) 5 MG tablet    Sig: Take 1 tablet (5 mg total) by mouth daily.    Dispense:  90 tablet    Refill:  2    Order Specific Question:   Supervising Provider    Answer:   Pricilla Holm A [id number]     Follow-up: Return if symptoms worsen or fail to improve.   Mauricio Po, FNP

## 2015-12-17 ENCOUNTER — Encounter: Payer: Self-pay | Admitting: Family

## 2016-01-01 DIAGNOSIS — I739 Peripheral vascular disease, unspecified: Secondary | ICD-10-CM | POA: Diagnosis not present

## 2016-01-01 DIAGNOSIS — L603 Nail dystrophy: Secondary | ICD-10-CM | POA: Diagnosis not present

## 2016-01-07 DIAGNOSIS — M170 Bilateral primary osteoarthritis of knee: Secondary | ICD-10-CM | POA: Diagnosis not present

## 2016-01-07 DIAGNOSIS — G8929 Other chronic pain: Secondary | ICD-10-CM | POA: Diagnosis not present

## 2016-01-07 DIAGNOSIS — M25512 Pain in left shoulder: Secondary | ICD-10-CM | POA: Diagnosis not present

## 2016-01-07 DIAGNOSIS — M5136 Other intervertebral disc degeneration, lumbar region: Secondary | ICD-10-CM | POA: Diagnosis not present

## 2016-01-07 DIAGNOSIS — S39012D Strain of muscle, fascia and tendon of lower back, subsequent encounter: Secondary | ICD-10-CM | POA: Diagnosis not present

## 2016-01-07 DIAGNOSIS — M1712 Unilateral primary osteoarthritis, left knee: Secondary | ICD-10-CM | POA: Diagnosis not present

## 2016-01-07 DIAGNOSIS — M545 Low back pain: Secondary | ICD-10-CM | POA: Diagnosis not present

## 2016-03-05 ENCOUNTER — Ambulatory Visit (INDEPENDENT_AMBULATORY_CARE_PROVIDER_SITE_OTHER): Payer: Medicare Other | Admitting: Ophthalmology

## 2016-03-19 DIAGNOSIS — L603 Nail dystrophy: Secondary | ICD-10-CM | POA: Diagnosis not present

## 2016-03-19 DIAGNOSIS — I739 Peripheral vascular disease, unspecified: Secondary | ICD-10-CM | POA: Diagnosis not present

## 2016-03-22 DIAGNOSIS — M5135 Other intervertebral disc degeneration, thoracolumbar region: Secondary | ICD-10-CM | POA: Diagnosis not present

## 2016-03-22 DIAGNOSIS — M5137 Other intervertebral disc degeneration, lumbosacral region: Secondary | ICD-10-CM | POA: Diagnosis not present

## 2016-03-22 DIAGNOSIS — M9902 Segmental and somatic dysfunction of thoracic region: Secondary | ICD-10-CM | POA: Diagnosis not present

## 2016-03-22 DIAGNOSIS — M25552 Pain in left hip: Secondary | ICD-10-CM | POA: Diagnosis not present

## 2016-03-22 DIAGNOSIS — M9905 Segmental and somatic dysfunction of pelvic region: Secondary | ICD-10-CM | POA: Diagnosis not present

## 2016-03-22 DIAGNOSIS — M9903 Segmental and somatic dysfunction of lumbar region: Secondary | ICD-10-CM | POA: Diagnosis not present

## 2016-03-22 DIAGNOSIS — M9904 Segmental and somatic dysfunction of sacral region: Secondary | ICD-10-CM | POA: Diagnosis not present

## 2016-04-01 DIAGNOSIS — M25552 Pain in left hip: Secondary | ICD-10-CM | POA: Diagnosis not present

## 2016-04-01 DIAGNOSIS — M79671 Pain in right foot: Secondary | ICD-10-CM | POA: Diagnosis not present

## 2016-04-01 DIAGNOSIS — M9902 Segmental and somatic dysfunction of thoracic region: Secondary | ICD-10-CM | POA: Diagnosis not present

## 2016-04-01 DIAGNOSIS — M79672 Pain in left foot: Secondary | ICD-10-CM | POA: Diagnosis not present

## 2016-04-01 DIAGNOSIS — M2142 Flat foot [pes planus] (acquired), left foot: Secondary | ICD-10-CM | POA: Diagnosis not present

## 2016-04-01 DIAGNOSIS — M9903 Segmental and somatic dysfunction of lumbar region: Secondary | ICD-10-CM | POA: Diagnosis not present

## 2016-04-01 DIAGNOSIS — M5137 Other intervertebral disc degeneration, lumbosacral region: Secondary | ICD-10-CM | POA: Diagnosis not present

## 2016-04-01 DIAGNOSIS — M9904 Segmental and somatic dysfunction of sacral region: Secondary | ICD-10-CM | POA: Diagnosis not present

## 2016-04-01 DIAGNOSIS — M2141 Flat foot [pes planus] (acquired), right foot: Secondary | ICD-10-CM | POA: Diagnosis not present

## 2016-04-01 DIAGNOSIS — M9905 Segmental and somatic dysfunction of pelvic region: Secondary | ICD-10-CM | POA: Diagnosis not present

## 2016-04-01 DIAGNOSIS — M5135 Other intervertebral disc degeneration, thoracolumbar region: Secondary | ICD-10-CM | POA: Diagnosis not present

## 2016-04-02 DIAGNOSIS — M9903 Segmental and somatic dysfunction of lumbar region: Secondary | ICD-10-CM | POA: Diagnosis not present

## 2016-04-02 DIAGNOSIS — M5135 Other intervertebral disc degeneration, thoracolumbar region: Secondary | ICD-10-CM | POA: Diagnosis not present

## 2016-04-02 DIAGNOSIS — M9904 Segmental and somatic dysfunction of sacral region: Secondary | ICD-10-CM | POA: Diagnosis not present

## 2016-04-02 DIAGNOSIS — M9905 Segmental and somatic dysfunction of pelvic region: Secondary | ICD-10-CM | POA: Diagnosis not present

## 2016-04-02 DIAGNOSIS — M9902 Segmental and somatic dysfunction of thoracic region: Secondary | ICD-10-CM | POA: Diagnosis not present

## 2016-04-02 DIAGNOSIS — M25552 Pain in left hip: Secondary | ICD-10-CM | POA: Diagnosis not present

## 2016-04-02 DIAGNOSIS — M79672 Pain in left foot: Secondary | ICD-10-CM | POA: Diagnosis not present

## 2016-04-02 DIAGNOSIS — M2142 Flat foot [pes planus] (acquired), left foot: Secondary | ICD-10-CM | POA: Diagnosis not present

## 2016-04-02 DIAGNOSIS — M79671 Pain in right foot: Secondary | ICD-10-CM | POA: Diagnosis not present

## 2016-04-02 DIAGNOSIS — M5137 Other intervertebral disc degeneration, lumbosacral region: Secondary | ICD-10-CM | POA: Diagnosis not present

## 2016-04-02 DIAGNOSIS — M2141 Flat foot [pes planus] (acquired), right foot: Secondary | ICD-10-CM | POA: Diagnosis not present

## 2016-04-05 ENCOUNTER — Ambulatory Visit (INDEPENDENT_AMBULATORY_CARE_PROVIDER_SITE_OTHER): Payer: Medicare Other | Admitting: Ophthalmology

## 2016-04-06 ENCOUNTER — Encounter: Payer: Self-pay | Admitting: Family

## 2016-04-06 DIAGNOSIS — M5137 Other intervertebral disc degeneration, lumbosacral region: Secondary | ICD-10-CM | POA: Diagnosis not present

## 2016-04-06 DIAGNOSIS — M9904 Segmental and somatic dysfunction of sacral region: Secondary | ICD-10-CM | POA: Diagnosis not present

## 2016-04-06 DIAGNOSIS — M2142 Flat foot [pes planus] (acquired), left foot: Secondary | ICD-10-CM | POA: Diagnosis not present

## 2016-04-06 DIAGNOSIS — M2141 Flat foot [pes planus] (acquired), right foot: Secondary | ICD-10-CM | POA: Diagnosis not present

## 2016-04-06 DIAGNOSIS — M9902 Segmental and somatic dysfunction of thoracic region: Secondary | ICD-10-CM | POA: Diagnosis not present

## 2016-04-06 DIAGNOSIS — M79671 Pain in right foot: Secondary | ICD-10-CM | POA: Diagnosis not present

## 2016-04-06 DIAGNOSIS — M9905 Segmental and somatic dysfunction of pelvic region: Secondary | ICD-10-CM | POA: Diagnosis not present

## 2016-04-06 DIAGNOSIS — M25552 Pain in left hip: Secondary | ICD-10-CM | POA: Diagnosis not present

## 2016-04-06 DIAGNOSIS — M9903 Segmental and somatic dysfunction of lumbar region: Secondary | ICD-10-CM | POA: Diagnosis not present

## 2016-04-06 DIAGNOSIS — M79672 Pain in left foot: Secondary | ICD-10-CM | POA: Diagnosis not present

## 2016-04-06 DIAGNOSIS — M5135 Other intervertebral disc degeneration, thoracolumbar region: Secondary | ICD-10-CM | POA: Diagnosis not present

## 2016-04-12 DIAGNOSIS — M79671 Pain in right foot: Secondary | ICD-10-CM | POA: Diagnosis not present

## 2016-04-12 DIAGNOSIS — M25552 Pain in left hip: Secondary | ICD-10-CM | POA: Diagnosis not present

## 2016-04-12 DIAGNOSIS — M2142 Flat foot [pes planus] (acquired), left foot: Secondary | ICD-10-CM | POA: Diagnosis not present

## 2016-04-12 DIAGNOSIS — M79672 Pain in left foot: Secondary | ICD-10-CM | POA: Diagnosis not present

## 2016-04-12 DIAGNOSIS — M9902 Segmental and somatic dysfunction of thoracic region: Secondary | ICD-10-CM | POA: Diagnosis not present

## 2016-04-12 DIAGNOSIS — M5135 Other intervertebral disc degeneration, thoracolumbar region: Secondary | ICD-10-CM | POA: Diagnosis not present

## 2016-04-12 DIAGNOSIS — M9905 Segmental and somatic dysfunction of pelvic region: Secondary | ICD-10-CM | POA: Diagnosis not present

## 2016-04-12 DIAGNOSIS — M5137 Other intervertebral disc degeneration, lumbosacral region: Secondary | ICD-10-CM | POA: Diagnosis not present

## 2016-04-12 DIAGNOSIS — M9904 Segmental and somatic dysfunction of sacral region: Secondary | ICD-10-CM | POA: Diagnosis not present

## 2016-04-12 DIAGNOSIS — M2141 Flat foot [pes planus] (acquired), right foot: Secondary | ICD-10-CM | POA: Diagnosis not present

## 2016-04-12 DIAGNOSIS — M9903 Segmental and somatic dysfunction of lumbar region: Secondary | ICD-10-CM | POA: Diagnosis not present

## 2016-04-15 DIAGNOSIS — M9903 Segmental and somatic dysfunction of lumbar region: Secondary | ICD-10-CM | POA: Diagnosis not present

## 2016-04-15 DIAGNOSIS — M5135 Other intervertebral disc degeneration, thoracolumbar region: Secondary | ICD-10-CM | POA: Diagnosis not present

## 2016-04-15 DIAGNOSIS — M2142 Flat foot [pes planus] (acquired), left foot: Secondary | ICD-10-CM | POA: Diagnosis not present

## 2016-04-15 DIAGNOSIS — M2141 Flat foot [pes planus] (acquired), right foot: Secondary | ICD-10-CM | POA: Diagnosis not present

## 2016-04-15 DIAGNOSIS — M25552 Pain in left hip: Secondary | ICD-10-CM | POA: Diagnosis not present

## 2016-04-15 DIAGNOSIS — M9904 Segmental and somatic dysfunction of sacral region: Secondary | ICD-10-CM | POA: Diagnosis not present

## 2016-04-15 DIAGNOSIS — M79672 Pain in left foot: Secondary | ICD-10-CM | POA: Diagnosis not present

## 2016-04-15 DIAGNOSIS — M9905 Segmental and somatic dysfunction of pelvic region: Secondary | ICD-10-CM | POA: Diagnosis not present

## 2016-04-15 DIAGNOSIS — M9902 Segmental and somatic dysfunction of thoracic region: Secondary | ICD-10-CM | POA: Diagnosis not present

## 2016-04-15 DIAGNOSIS — M5137 Other intervertebral disc degeneration, lumbosacral region: Secondary | ICD-10-CM | POA: Diagnosis not present

## 2016-04-15 DIAGNOSIS — M79671 Pain in right foot: Secondary | ICD-10-CM | POA: Diagnosis not present

## 2016-04-19 DIAGNOSIS — M9905 Segmental and somatic dysfunction of pelvic region: Secondary | ICD-10-CM | POA: Diagnosis not present

## 2016-04-19 DIAGNOSIS — M5137 Other intervertebral disc degeneration, lumbosacral region: Secondary | ICD-10-CM | POA: Diagnosis not present

## 2016-04-19 DIAGNOSIS — M2142 Flat foot [pes planus] (acquired), left foot: Secondary | ICD-10-CM | POA: Diagnosis not present

## 2016-04-19 DIAGNOSIS — M9903 Segmental and somatic dysfunction of lumbar region: Secondary | ICD-10-CM | POA: Diagnosis not present

## 2016-04-19 DIAGNOSIS — M79672 Pain in left foot: Secondary | ICD-10-CM | POA: Diagnosis not present

## 2016-04-19 DIAGNOSIS — M25552 Pain in left hip: Secondary | ICD-10-CM | POA: Diagnosis not present

## 2016-04-19 DIAGNOSIS — M5135 Other intervertebral disc degeneration, thoracolumbar region: Secondary | ICD-10-CM | POA: Diagnosis not present

## 2016-04-19 DIAGNOSIS — M2141 Flat foot [pes planus] (acquired), right foot: Secondary | ICD-10-CM | POA: Diagnosis not present

## 2016-04-19 DIAGNOSIS — M9902 Segmental and somatic dysfunction of thoracic region: Secondary | ICD-10-CM | POA: Diagnosis not present

## 2016-04-19 DIAGNOSIS — M9904 Segmental and somatic dysfunction of sacral region: Secondary | ICD-10-CM | POA: Diagnosis not present

## 2016-04-19 DIAGNOSIS — M79671 Pain in right foot: Secondary | ICD-10-CM | POA: Diagnosis not present

## 2016-04-21 DIAGNOSIS — M9903 Segmental and somatic dysfunction of lumbar region: Secondary | ICD-10-CM | POA: Diagnosis not present

## 2016-04-21 DIAGNOSIS — M79671 Pain in right foot: Secondary | ICD-10-CM | POA: Diagnosis not present

## 2016-04-21 DIAGNOSIS — M79672 Pain in left foot: Secondary | ICD-10-CM | POA: Diagnosis not present

## 2016-04-21 DIAGNOSIS — M5137 Other intervertebral disc degeneration, lumbosacral region: Secondary | ICD-10-CM | POA: Diagnosis not present

## 2016-04-21 DIAGNOSIS — M2142 Flat foot [pes planus] (acquired), left foot: Secondary | ICD-10-CM | POA: Diagnosis not present

## 2016-04-21 DIAGNOSIS — M25552 Pain in left hip: Secondary | ICD-10-CM | POA: Diagnosis not present

## 2016-04-21 DIAGNOSIS — M9905 Segmental and somatic dysfunction of pelvic region: Secondary | ICD-10-CM | POA: Diagnosis not present

## 2016-04-21 DIAGNOSIS — M9904 Segmental and somatic dysfunction of sacral region: Secondary | ICD-10-CM | POA: Diagnosis not present

## 2016-04-21 DIAGNOSIS — M5135 Other intervertebral disc degeneration, thoracolumbar region: Secondary | ICD-10-CM | POA: Diagnosis not present

## 2016-04-21 DIAGNOSIS — M9902 Segmental and somatic dysfunction of thoracic region: Secondary | ICD-10-CM | POA: Diagnosis not present

## 2016-04-21 DIAGNOSIS — M2141 Flat foot [pes planus] (acquired), right foot: Secondary | ICD-10-CM | POA: Diagnosis not present

## 2016-04-22 DIAGNOSIS — M2142 Flat foot [pes planus] (acquired), left foot: Secondary | ICD-10-CM | POA: Diagnosis not present

## 2016-04-22 DIAGNOSIS — M5135 Other intervertebral disc degeneration, thoracolumbar region: Secondary | ICD-10-CM | POA: Diagnosis not present

## 2016-04-22 DIAGNOSIS — M9903 Segmental and somatic dysfunction of lumbar region: Secondary | ICD-10-CM | POA: Diagnosis not present

## 2016-04-22 DIAGNOSIS — M2141 Flat foot [pes planus] (acquired), right foot: Secondary | ICD-10-CM | POA: Diagnosis not present

## 2016-04-22 DIAGNOSIS — M9905 Segmental and somatic dysfunction of pelvic region: Secondary | ICD-10-CM | POA: Diagnosis not present

## 2016-04-22 DIAGNOSIS — M9904 Segmental and somatic dysfunction of sacral region: Secondary | ICD-10-CM | POA: Diagnosis not present

## 2016-04-22 DIAGNOSIS — M79672 Pain in left foot: Secondary | ICD-10-CM | POA: Diagnosis not present

## 2016-04-22 DIAGNOSIS — M79671 Pain in right foot: Secondary | ICD-10-CM | POA: Diagnosis not present

## 2016-04-22 DIAGNOSIS — M25552 Pain in left hip: Secondary | ICD-10-CM | POA: Diagnosis not present

## 2016-04-22 DIAGNOSIS — M5137 Other intervertebral disc degeneration, lumbosacral region: Secondary | ICD-10-CM | POA: Diagnosis not present

## 2016-04-22 DIAGNOSIS — M9902 Segmental and somatic dysfunction of thoracic region: Secondary | ICD-10-CM | POA: Diagnosis not present

## 2016-04-26 DIAGNOSIS — M9902 Segmental and somatic dysfunction of thoracic region: Secondary | ICD-10-CM | POA: Diagnosis not present

## 2016-04-26 DIAGNOSIS — M9905 Segmental and somatic dysfunction of pelvic region: Secondary | ICD-10-CM | POA: Diagnosis not present

## 2016-04-26 DIAGNOSIS — M79672 Pain in left foot: Secondary | ICD-10-CM | POA: Diagnosis not present

## 2016-04-26 DIAGNOSIS — M25552 Pain in left hip: Secondary | ICD-10-CM | POA: Diagnosis not present

## 2016-04-26 DIAGNOSIS — M79671 Pain in right foot: Secondary | ICD-10-CM | POA: Diagnosis not present

## 2016-04-26 DIAGNOSIS — M9904 Segmental and somatic dysfunction of sacral region: Secondary | ICD-10-CM | POA: Diagnosis not present

## 2016-04-26 DIAGNOSIS — M2141 Flat foot [pes planus] (acquired), right foot: Secondary | ICD-10-CM | POA: Diagnosis not present

## 2016-04-26 DIAGNOSIS — M5137 Other intervertebral disc degeneration, lumbosacral region: Secondary | ICD-10-CM | POA: Diagnosis not present

## 2016-04-26 DIAGNOSIS — M2142 Flat foot [pes planus] (acquired), left foot: Secondary | ICD-10-CM | POA: Diagnosis not present

## 2016-04-26 DIAGNOSIS — M5135 Other intervertebral disc degeneration, thoracolumbar region: Secondary | ICD-10-CM | POA: Diagnosis not present

## 2016-04-26 DIAGNOSIS — M9903 Segmental and somatic dysfunction of lumbar region: Secondary | ICD-10-CM | POA: Diagnosis not present

## 2016-04-28 ENCOUNTER — Ambulatory Visit
Admission: RE | Admit: 2016-04-28 | Discharge: 2016-04-28 | Disposition: A | Discharge status: Home / Self Care | Payer: Medicare Other | Source: Ambulatory Visit | Attending: Endocrinology | Admitting: Endocrinology

## 2016-04-28 ENCOUNTER — Other Ambulatory Visit: Payer: Self-pay | Admitting: Family

## 2016-04-28 DIAGNOSIS — I10 Essential (primary) hypertension: Secondary | ICD-10-CM

## 2016-04-28 DIAGNOSIS — E042 Nontoxic multinodular goiter: Secondary | ICD-10-CM | POA: Diagnosis not present

## 2016-04-28 DIAGNOSIS — E041 Nontoxic single thyroid nodule: Secondary | ICD-10-CM

## 2016-04-29 DIAGNOSIS — M9904 Segmental and somatic dysfunction of sacral region: Secondary | ICD-10-CM | POA: Diagnosis not present

## 2016-04-29 DIAGNOSIS — M5135 Other intervertebral disc degeneration, thoracolumbar region: Secondary | ICD-10-CM | POA: Diagnosis not present

## 2016-04-29 DIAGNOSIS — M9905 Segmental and somatic dysfunction of pelvic region: Secondary | ICD-10-CM | POA: Diagnosis not present

## 2016-04-29 DIAGNOSIS — M5137 Other intervertebral disc degeneration, lumbosacral region: Secondary | ICD-10-CM | POA: Diagnosis not present

## 2016-04-29 DIAGNOSIS — M79672 Pain in left foot: Secondary | ICD-10-CM | POA: Diagnosis not present

## 2016-04-29 DIAGNOSIS — M2141 Flat foot [pes planus] (acquired), right foot: Secondary | ICD-10-CM | POA: Diagnosis not present

## 2016-04-29 DIAGNOSIS — M2142 Flat foot [pes planus] (acquired), left foot: Secondary | ICD-10-CM | POA: Diagnosis not present

## 2016-04-29 DIAGNOSIS — M25552 Pain in left hip: Secondary | ICD-10-CM | POA: Diagnosis not present

## 2016-04-29 DIAGNOSIS — M9903 Segmental and somatic dysfunction of lumbar region: Secondary | ICD-10-CM | POA: Diagnosis not present

## 2016-04-29 DIAGNOSIS — M9902 Segmental and somatic dysfunction of thoracic region: Secondary | ICD-10-CM | POA: Diagnosis not present

## 2016-04-29 DIAGNOSIS — M79671 Pain in right foot: Secondary | ICD-10-CM | POA: Diagnosis not present

## 2016-05-03 DIAGNOSIS — M79672 Pain in left foot: Secondary | ICD-10-CM | POA: Diagnosis not present

## 2016-05-03 DIAGNOSIS — M25552 Pain in left hip: Secondary | ICD-10-CM | POA: Diagnosis not present

## 2016-05-03 DIAGNOSIS — M9905 Segmental and somatic dysfunction of pelvic region: Secondary | ICD-10-CM | POA: Diagnosis not present

## 2016-05-03 DIAGNOSIS — M5135 Other intervertebral disc degeneration, thoracolumbar region: Secondary | ICD-10-CM | POA: Diagnosis not present

## 2016-05-03 DIAGNOSIS — M79671 Pain in right foot: Secondary | ICD-10-CM | POA: Diagnosis not present

## 2016-05-03 DIAGNOSIS — M9902 Segmental and somatic dysfunction of thoracic region: Secondary | ICD-10-CM | POA: Diagnosis not present

## 2016-05-03 DIAGNOSIS — M2142 Flat foot [pes planus] (acquired), left foot: Secondary | ICD-10-CM | POA: Diagnosis not present

## 2016-05-03 DIAGNOSIS — M9904 Segmental and somatic dysfunction of sacral region: Secondary | ICD-10-CM | POA: Diagnosis not present

## 2016-05-03 DIAGNOSIS — M2141 Flat foot [pes planus] (acquired), right foot: Secondary | ICD-10-CM | POA: Diagnosis not present

## 2016-05-03 DIAGNOSIS — M5137 Other intervertebral disc degeneration, lumbosacral region: Secondary | ICD-10-CM | POA: Diagnosis not present

## 2016-05-03 DIAGNOSIS — M9903 Segmental and somatic dysfunction of lumbar region: Secondary | ICD-10-CM | POA: Diagnosis not present

## 2016-05-04 DIAGNOSIS — E041 Nontoxic single thyroid nodule: Secondary | ICD-10-CM | POA: Diagnosis not present

## 2016-05-04 DIAGNOSIS — M810 Age-related osteoporosis without current pathological fracture: Secondary | ICD-10-CM | POA: Diagnosis not present

## 2016-05-06 DIAGNOSIS — M2142 Flat foot [pes planus] (acquired), left foot: Secondary | ICD-10-CM | POA: Diagnosis not present

## 2016-05-06 DIAGNOSIS — M5137 Other intervertebral disc degeneration, lumbosacral region: Secondary | ICD-10-CM | POA: Diagnosis not present

## 2016-05-06 DIAGNOSIS — M9903 Segmental and somatic dysfunction of lumbar region: Secondary | ICD-10-CM | POA: Diagnosis not present

## 2016-05-06 DIAGNOSIS — M2141 Flat foot [pes planus] (acquired), right foot: Secondary | ICD-10-CM | POA: Diagnosis not present

## 2016-05-06 DIAGNOSIS — M79672 Pain in left foot: Secondary | ICD-10-CM | POA: Diagnosis not present

## 2016-05-06 DIAGNOSIS — M25552 Pain in left hip: Secondary | ICD-10-CM | POA: Diagnosis not present

## 2016-05-06 DIAGNOSIS — M9905 Segmental and somatic dysfunction of pelvic region: Secondary | ICD-10-CM | POA: Diagnosis not present

## 2016-05-06 DIAGNOSIS — M9904 Segmental and somatic dysfunction of sacral region: Secondary | ICD-10-CM | POA: Diagnosis not present

## 2016-05-06 DIAGNOSIS — M9902 Segmental and somatic dysfunction of thoracic region: Secondary | ICD-10-CM | POA: Diagnosis not present

## 2016-05-06 DIAGNOSIS — M5135 Other intervertebral disc degeneration, thoracolumbar region: Secondary | ICD-10-CM | POA: Diagnosis not present

## 2016-05-06 DIAGNOSIS — M79671 Pain in right foot: Secondary | ICD-10-CM | POA: Diagnosis not present

## 2016-05-11 DIAGNOSIS — E041 Nontoxic single thyroid nodule: Secondary | ICD-10-CM | POA: Diagnosis not present

## 2016-05-11 DIAGNOSIS — I10 Essential (primary) hypertension: Secondary | ICD-10-CM | POA: Diagnosis not present

## 2016-05-13 ENCOUNTER — Other Ambulatory Visit: Payer: Self-pay | Admitting: Endocrinology

## 2016-05-13 DIAGNOSIS — M9904 Segmental and somatic dysfunction of sacral region: Secondary | ICD-10-CM | POA: Diagnosis not present

## 2016-05-13 DIAGNOSIS — M79671 Pain in right foot: Secondary | ICD-10-CM | POA: Diagnosis not present

## 2016-05-13 DIAGNOSIS — I6529 Occlusion and stenosis of unspecified carotid artery: Secondary | ICD-10-CM

## 2016-05-13 DIAGNOSIS — M79672 Pain in left foot: Secondary | ICD-10-CM | POA: Diagnosis not present

## 2016-05-13 DIAGNOSIS — M2141 Flat foot [pes planus] (acquired), right foot: Secondary | ICD-10-CM | POA: Diagnosis not present

## 2016-05-13 DIAGNOSIS — M5135 Other intervertebral disc degeneration, thoracolumbar region: Secondary | ICD-10-CM | POA: Diagnosis not present

## 2016-05-13 DIAGNOSIS — M9905 Segmental and somatic dysfunction of pelvic region: Secondary | ICD-10-CM | POA: Diagnosis not present

## 2016-05-13 DIAGNOSIS — M9903 Segmental and somatic dysfunction of lumbar region: Secondary | ICD-10-CM | POA: Diagnosis not present

## 2016-05-13 DIAGNOSIS — M9902 Segmental and somatic dysfunction of thoracic region: Secondary | ICD-10-CM | POA: Diagnosis not present

## 2016-05-13 DIAGNOSIS — M25552 Pain in left hip: Secondary | ICD-10-CM | POA: Diagnosis not present

## 2016-05-13 DIAGNOSIS — M2142 Flat foot [pes planus] (acquired), left foot: Secondary | ICD-10-CM | POA: Diagnosis not present

## 2016-05-13 DIAGNOSIS — M5137 Other intervertebral disc degeneration, lumbosacral region: Secondary | ICD-10-CM | POA: Diagnosis not present

## 2016-05-17 DIAGNOSIS — M5137 Other intervertebral disc degeneration, lumbosacral region: Secondary | ICD-10-CM | POA: Diagnosis not present

## 2016-05-17 DIAGNOSIS — M9905 Segmental and somatic dysfunction of pelvic region: Secondary | ICD-10-CM | POA: Diagnosis not present

## 2016-05-17 DIAGNOSIS — M9903 Segmental and somatic dysfunction of lumbar region: Secondary | ICD-10-CM | POA: Diagnosis not present

## 2016-05-17 DIAGNOSIS — M2141 Flat foot [pes planus] (acquired), right foot: Secondary | ICD-10-CM | POA: Diagnosis not present

## 2016-05-17 DIAGNOSIS — M9902 Segmental and somatic dysfunction of thoracic region: Secondary | ICD-10-CM | POA: Diagnosis not present

## 2016-05-17 DIAGNOSIS — M79671 Pain in right foot: Secondary | ICD-10-CM | POA: Diagnosis not present

## 2016-05-17 DIAGNOSIS — M79672 Pain in left foot: Secondary | ICD-10-CM | POA: Diagnosis not present

## 2016-05-17 DIAGNOSIS — M25552 Pain in left hip: Secondary | ICD-10-CM | POA: Diagnosis not present

## 2016-05-17 DIAGNOSIS — M9904 Segmental and somatic dysfunction of sacral region: Secondary | ICD-10-CM | POA: Diagnosis not present

## 2016-05-17 DIAGNOSIS — M2142 Flat foot [pes planus] (acquired), left foot: Secondary | ICD-10-CM | POA: Diagnosis not present

## 2016-05-17 DIAGNOSIS — M5135 Other intervertebral disc degeneration, thoracolumbar region: Secondary | ICD-10-CM | POA: Diagnosis not present

## 2016-05-20 DIAGNOSIS — M9902 Segmental and somatic dysfunction of thoracic region: Secondary | ICD-10-CM | POA: Diagnosis not present

## 2016-05-20 DIAGNOSIS — M79671 Pain in right foot: Secondary | ICD-10-CM | POA: Diagnosis not present

## 2016-05-20 DIAGNOSIS — M2141 Flat foot [pes planus] (acquired), right foot: Secondary | ICD-10-CM | POA: Diagnosis not present

## 2016-05-20 DIAGNOSIS — M5135 Other intervertebral disc degeneration, thoracolumbar region: Secondary | ICD-10-CM | POA: Diagnosis not present

## 2016-05-20 DIAGNOSIS — M79672 Pain in left foot: Secondary | ICD-10-CM | POA: Diagnosis not present

## 2016-05-20 DIAGNOSIS — M5137 Other intervertebral disc degeneration, lumbosacral region: Secondary | ICD-10-CM | POA: Diagnosis not present

## 2016-05-20 DIAGNOSIS — M2142 Flat foot [pes planus] (acquired), left foot: Secondary | ICD-10-CM | POA: Diagnosis not present

## 2016-05-20 DIAGNOSIS — M9905 Segmental and somatic dysfunction of pelvic region: Secondary | ICD-10-CM | POA: Diagnosis not present

## 2016-05-20 DIAGNOSIS — M25552 Pain in left hip: Secondary | ICD-10-CM | POA: Diagnosis not present

## 2016-05-20 DIAGNOSIS — M9903 Segmental and somatic dysfunction of lumbar region: Secondary | ICD-10-CM | POA: Diagnosis not present

## 2016-05-20 DIAGNOSIS — M9904 Segmental and somatic dysfunction of sacral region: Secondary | ICD-10-CM | POA: Diagnosis not present

## 2016-05-21 ENCOUNTER — Ambulatory Visit
Admission: RE | Admit: 2016-05-21 | Discharge: 2016-05-21 | Disposition: A | Discharge status: Home / Self Care | Payer: Medicare Other | Source: Ambulatory Visit | Attending: Endocrinology | Admitting: Endocrinology

## 2016-05-21 DIAGNOSIS — I6529 Occlusion and stenosis of unspecified carotid artery: Secondary | ICD-10-CM

## 2016-05-21 DIAGNOSIS — I6523 Occlusion and stenosis of bilateral carotid arteries: Secondary | ICD-10-CM | POA: Diagnosis not present

## 2016-05-27 DIAGNOSIS — M25552 Pain in left hip: Secondary | ICD-10-CM | POA: Diagnosis not present

## 2016-05-27 DIAGNOSIS — M2141 Flat foot [pes planus] (acquired), right foot: Secondary | ICD-10-CM | POA: Diagnosis not present

## 2016-05-27 DIAGNOSIS — M9905 Segmental and somatic dysfunction of pelvic region: Secondary | ICD-10-CM | POA: Diagnosis not present

## 2016-05-27 DIAGNOSIS — M5137 Other intervertebral disc degeneration, lumbosacral region: Secondary | ICD-10-CM | POA: Diagnosis not present

## 2016-05-27 DIAGNOSIS — M9902 Segmental and somatic dysfunction of thoracic region: Secondary | ICD-10-CM | POA: Diagnosis not present

## 2016-05-27 DIAGNOSIS — M79672 Pain in left foot: Secondary | ICD-10-CM | POA: Diagnosis not present

## 2016-05-27 DIAGNOSIS — M79671 Pain in right foot: Secondary | ICD-10-CM | POA: Diagnosis not present

## 2016-05-27 DIAGNOSIS — M9904 Segmental and somatic dysfunction of sacral region: Secondary | ICD-10-CM | POA: Diagnosis not present

## 2016-05-27 DIAGNOSIS — M5135 Other intervertebral disc degeneration, thoracolumbar region: Secondary | ICD-10-CM | POA: Diagnosis not present

## 2016-05-27 DIAGNOSIS — M2142 Flat foot [pes planus] (acquired), left foot: Secondary | ICD-10-CM | POA: Diagnosis not present

## 2016-05-27 DIAGNOSIS — M9903 Segmental and somatic dysfunction of lumbar region: Secondary | ICD-10-CM | POA: Diagnosis not present

## 2016-05-31 DIAGNOSIS — M9905 Segmental and somatic dysfunction of pelvic region: Secondary | ICD-10-CM | POA: Diagnosis not present

## 2016-05-31 DIAGNOSIS — M5137 Other intervertebral disc degeneration, lumbosacral region: Secondary | ICD-10-CM | POA: Diagnosis not present

## 2016-05-31 DIAGNOSIS — M2142 Flat foot [pes planus] (acquired), left foot: Secondary | ICD-10-CM | POA: Diagnosis not present

## 2016-05-31 DIAGNOSIS — M79671 Pain in right foot: Secondary | ICD-10-CM | POA: Diagnosis not present

## 2016-05-31 DIAGNOSIS — M9904 Segmental and somatic dysfunction of sacral region: Secondary | ICD-10-CM | POA: Diagnosis not present

## 2016-05-31 DIAGNOSIS — M2141 Flat foot [pes planus] (acquired), right foot: Secondary | ICD-10-CM | POA: Diagnosis not present

## 2016-05-31 DIAGNOSIS — M5135 Other intervertebral disc degeneration, thoracolumbar region: Secondary | ICD-10-CM | POA: Diagnosis not present

## 2016-05-31 DIAGNOSIS — M25552 Pain in left hip: Secondary | ICD-10-CM | POA: Diagnosis not present

## 2016-05-31 DIAGNOSIS — M79672 Pain in left foot: Secondary | ICD-10-CM | POA: Diagnosis not present

## 2016-05-31 DIAGNOSIS — M9902 Segmental and somatic dysfunction of thoracic region: Secondary | ICD-10-CM | POA: Diagnosis not present

## 2016-05-31 DIAGNOSIS — M9903 Segmental and somatic dysfunction of lumbar region: Secondary | ICD-10-CM | POA: Diagnosis not present

## 2016-06-01 DIAGNOSIS — L570 Actinic keratosis: Secondary | ICD-10-CM | POA: Diagnosis not present

## 2016-06-01 DIAGNOSIS — L821 Other seborrheic keratosis: Secondary | ICD-10-CM | POA: Diagnosis not present

## 2016-06-01 DIAGNOSIS — L853 Xerosis cutis: Secondary | ICD-10-CM | POA: Diagnosis not present

## 2016-06-01 DIAGNOSIS — R202 Paresthesia of skin: Secondary | ICD-10-CM | POA: Diagnosis not present

## 2016-06-03 DIAGNOSIS — M5137 Other intervertebral disc degeneration, lumbosacral region: Secondary | ICD-10-CM | POA: Diagnosis not present

## 2016-06-03 DIAGNOSIS — M9902 Segmental and somatic dysfunction of thoracic region: Secondary | ICD-10-CM | POA: Diagnosis not present

## 2016-06-03 DIAGNOSIS — M25552 Pain in left hip: Secondary | ICD-10-CM | POA: Diagnosis not present

## 2016-06-03 DIAGNOSIS — M5135 Other intervertebral disc degeneration, thoracolumbar region: Secondary | ICD-10-CM | POA: Diagnosis not present

## 2016-06-03 DIAGNOSIS — M79671 Pain in right foot: Secondary | ICD-10-CM | POA: Diagnosis not present

## 2016-06-03 DIAGNOSIS — M9904 Segmental and somatic dysfunction of sacral region: Secondary | ICD-10-CM | POA: Diagnosis not present

## 2016-06-03 DIAGNOSIS — M2141 Flat foot [pes planus] (acquired), right foot: Secondary | ICD-10-CM | POA: Diagnosis not present

## 2016-06-03 DIAGNOSIS — M9905 Segmental and somatic dysfunction of pelvic region: Secondary | ICD-10-CM | POA: Diagnosis not present

## 2016-06-03 DIAGNOSIS — M2142 Flat foot [pes planus] (acquired), left foot: Secondary | ICD-10-CM | POA: Diagnosis not present

## 2016-06-03 DIAGNOSIS — M79672 Pain in left foot: Secondary | ICD-10-CM | POA: Diagnosis not present

## 2016-06-03 DIAGNOSIS — M9903 Segmental and somatic dysfunction of lumbar region: Secondary | ICD-10-CM | POA: Diagnosis not present

## 2016-06-09 ENCOUNTER — Encounter: Payer: Self-pay | Admitting: Gynecology

## 2016-06-17 ENCOUNTER — Other Ambulatory Visit: Payer: Self-pay | Admitting: Family

## 2016-06-17 DIAGNOSIS — I10 Essential (primary) hypertension: Secondary | ICD-10-CM

## 2016-06-25 DIAGNOSIS — I739 Peripheral vascular disease, unspecified: Secondary | ICD-10-CM | POA: Diagnosis not present

## 2016-06-25 DIAGNOSIS — L603 Nail dystrophy: Secondary | ICD-10-CM | POA: Diagnosis not present

## 2016-07-05 DIAGNOSIS — M79672 Pain in left foot: Secondary | ICD-10-CM | POA: Diagnosis not present

## 2016-07-05 DIAGNOSIS — M2142 Flat foot [pes planus] (acquired), left foot: Secondary | ICD-10-CM | POA: Diagnosis not present

## 2016-07-05 DIAGNOSIS — M9902 Segmental and somatic dysfunction of thoracic region: Secondary | ICD-10-CM | POA: Diagnosis not present

## 2016-07-05 DIAGNOSIS — M5137 Other intervertebral disc degeneration, lumbosacral region: Secondary | ICD-10-CM | POA: Diagnosis not present

## 2016-07-05 DIAGNOSIS — M25552 Pain in left hip: Secondary | ICD-10-CM | POA: Diagnosis not present

## 2016-07-05 DIAGNOSIS — M9905 Segmental and somatic dysfunction of pelvic region: Secondary | ICD-10-CM | POA: Diagnosis not present

## 2016-07-05 DIAGNOSIS — M9904 Segmental and somatic dysfunction of sacral region: Secondary | ICD-10-CM | POA: Diagnosis not present

## 2016-07-05 DIAGNOSIS — M9903 Segmental and somatic dysfunction of lumbar region: Secondary | ICD-10-CM | POA: Diagnosis not present

## 2016-07-05 DIAGNOSIS — M79671 Pain in right foot: Secondary | ICD-10-CM | POA: Diagnosis not present

## 2016-07-05 DIAGNOSIS — M2141 Flat foot [pes planus] (acquired), right foot: Secondary | ICD-10-CM | POA: Diagnosis not present

## 2016-07-05 DIAGNOSIS — M5135 Other intervertebral disc degeneration, thoracolumbar region: Secondary | ICD-10-CM | POA: Diagnosis not present

## 2016-08-23 IMAGING — US US SOFT TISSUE HEAD/NECK
1 series · 14 of 25 positions shown · non-contrast
Comparison: Prior thyroid ultrasound 12/07/2013

CLINICAL DATA: [AGE] female with thyroid nodule

EXAM:
THYROID ULTRASOUND
TECHNIQUE: Ultrasound examination of the thyroid gland and adjacent soft
tissues was performed.

[Series 1: us soft tissue head/neck · 0.06mm/px · 14 of 49 slices shown]
[im 1/49]
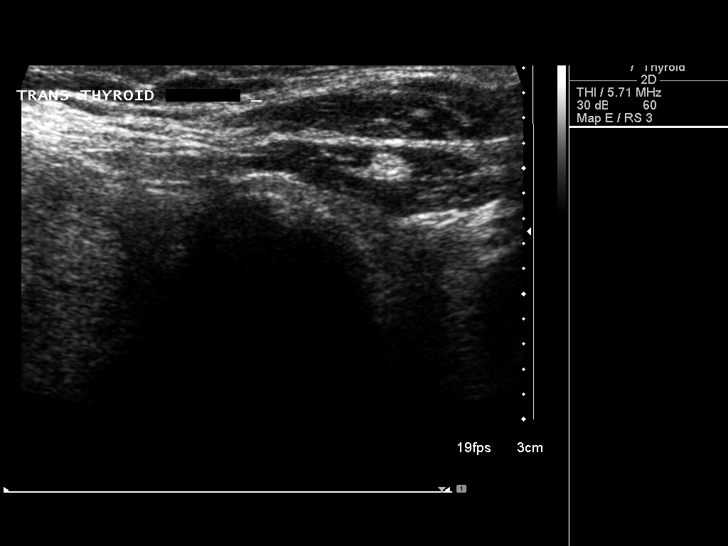
[im 5/49]
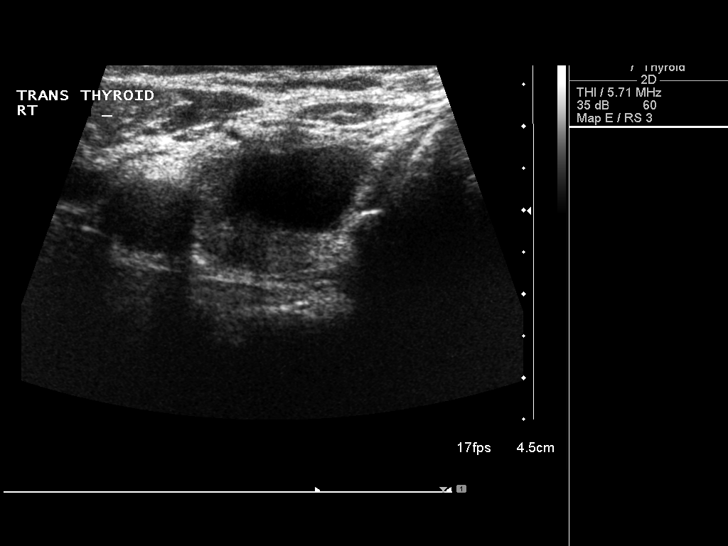
[im 9/49]
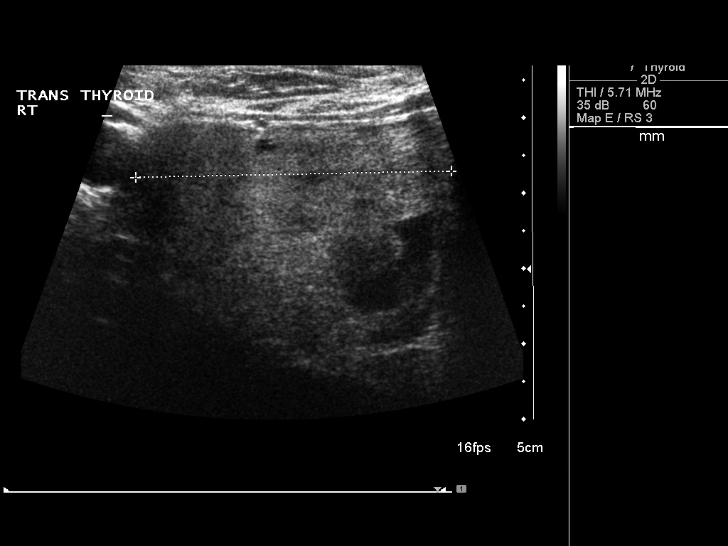
[im 13/49]
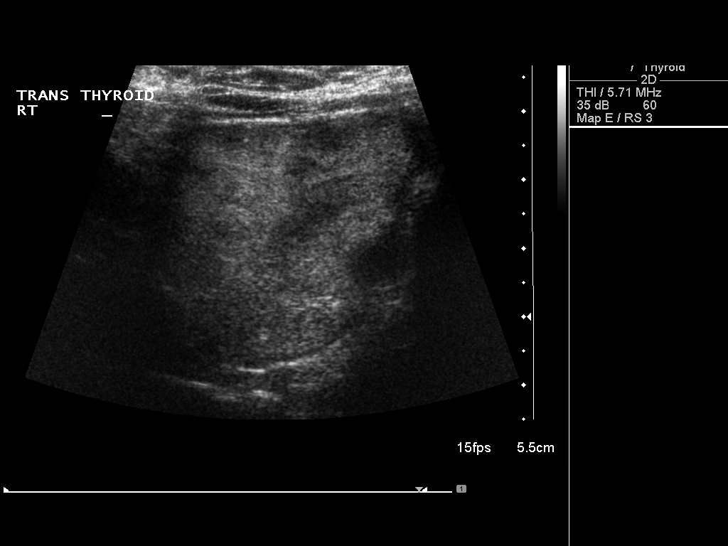
[im 17/49]
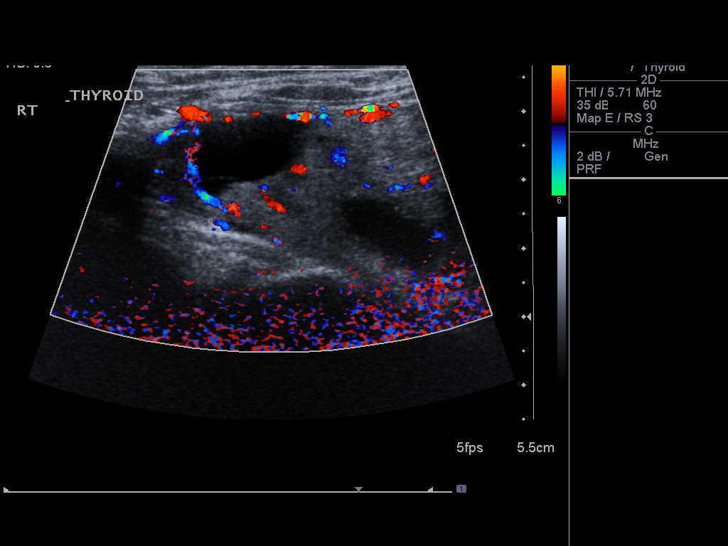
[im 19/49]
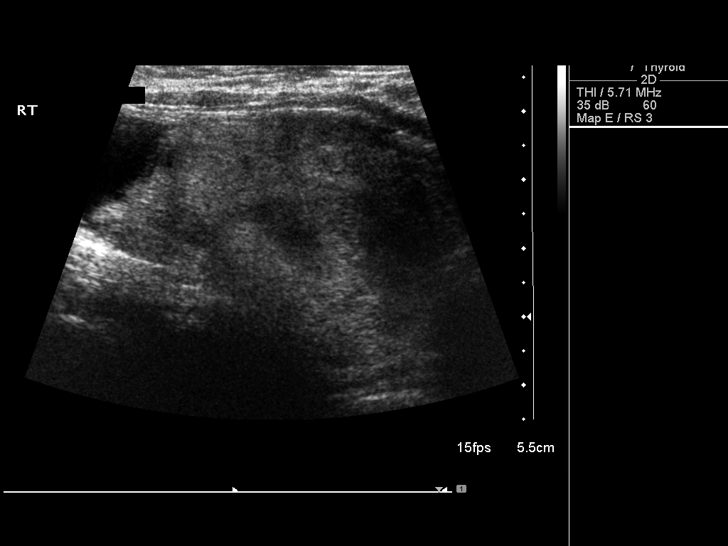
[im 23/49]
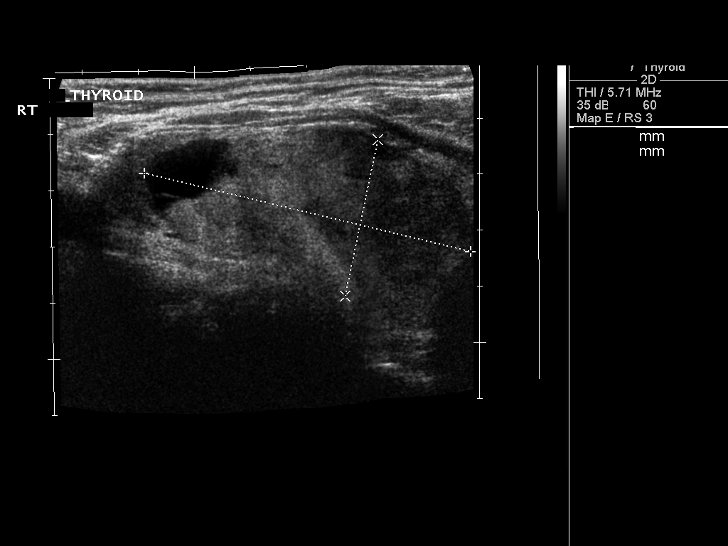
[im 27/49]
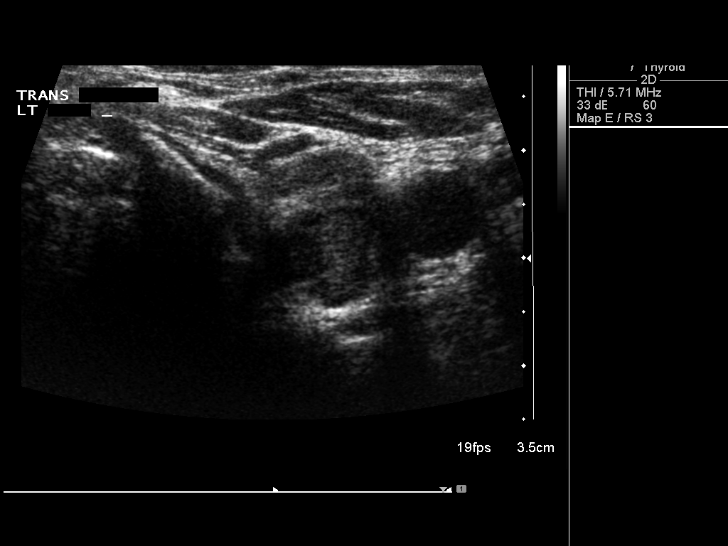
[im 31/49]
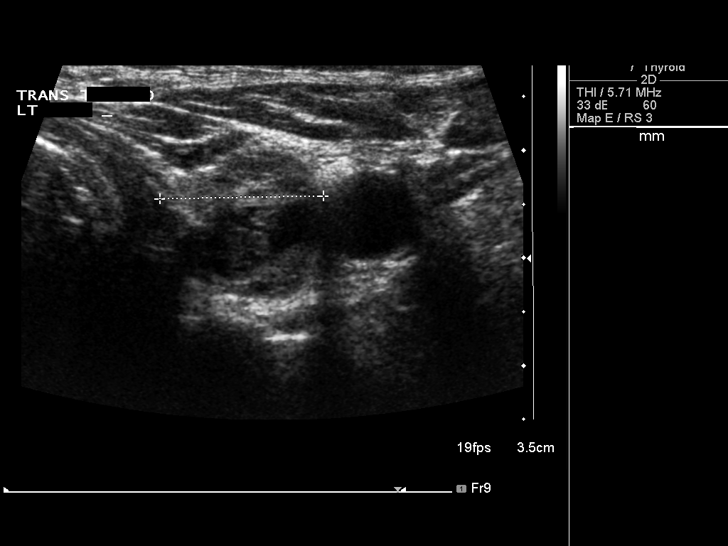
[im 33/49]
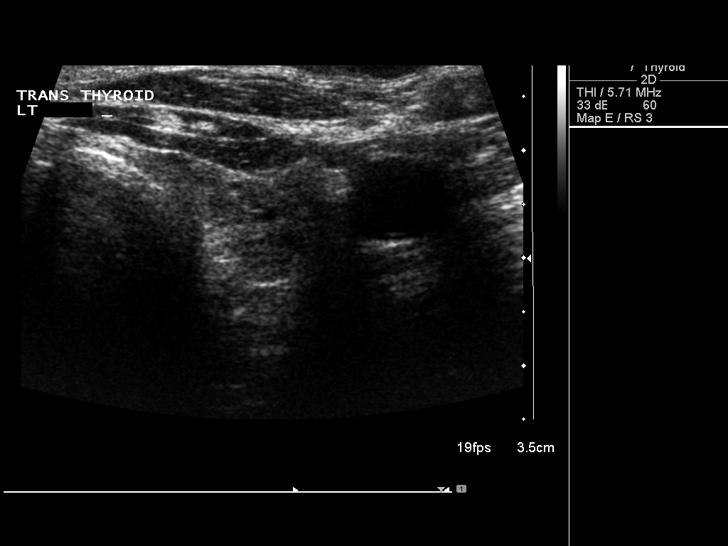
[im 37/49]
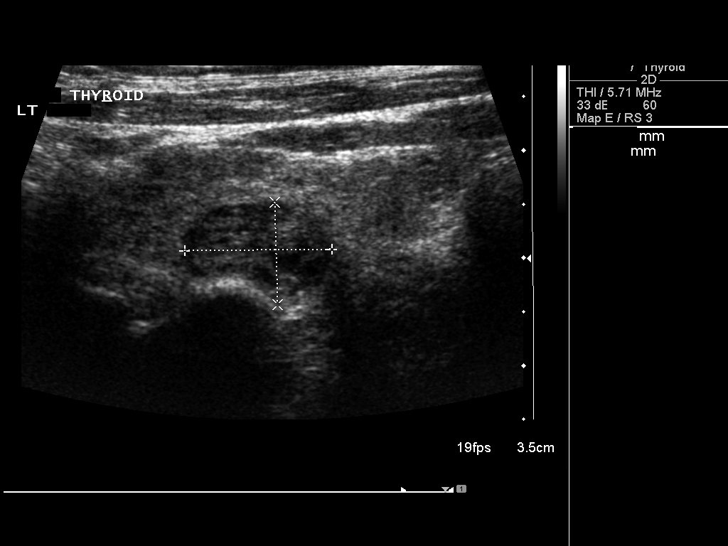
[im 41/49]
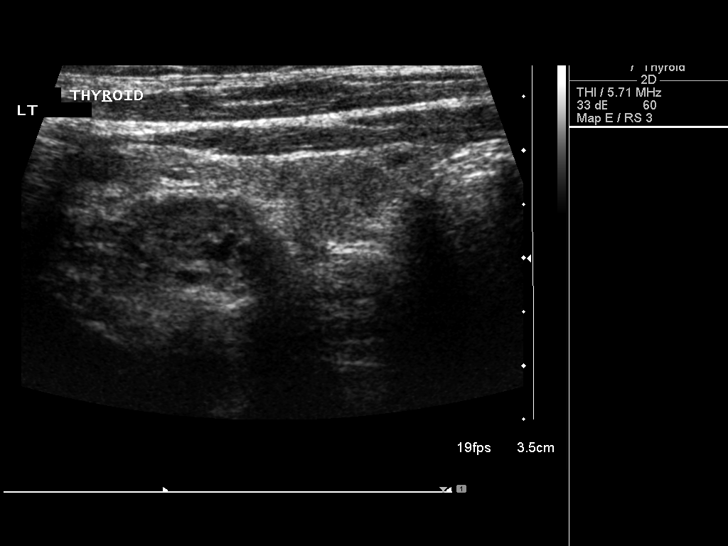
[im 45/49]
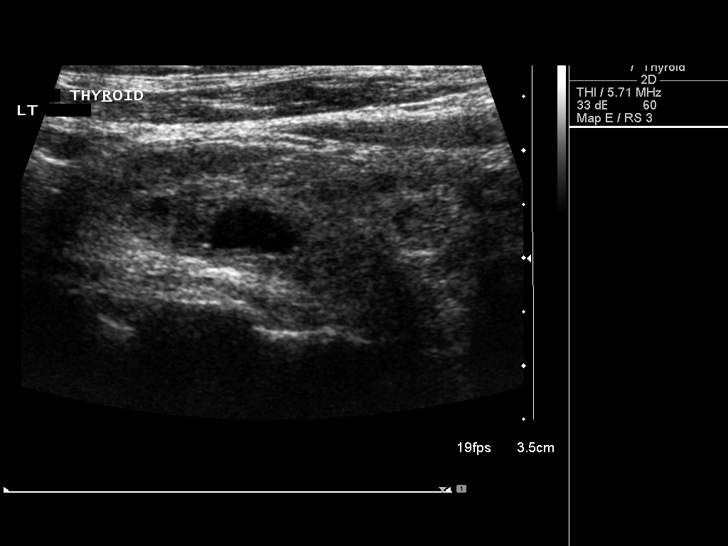
[im 49/49]
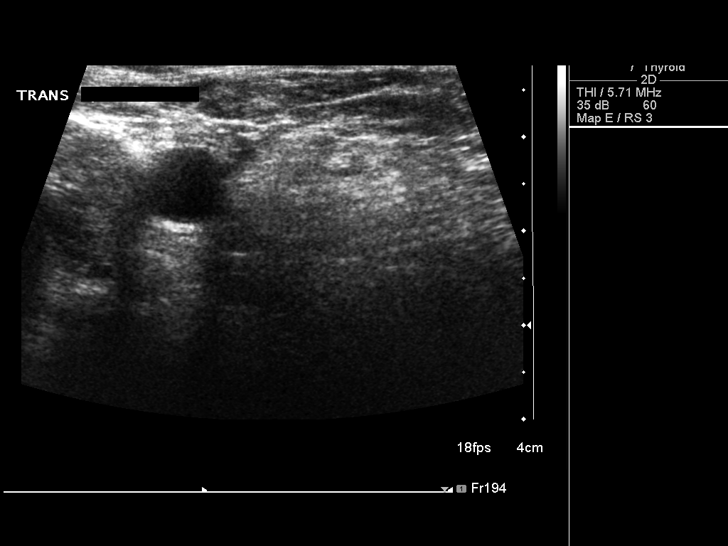

[14 of 25 positions shown; findings below may reference images not displayed]

FINDINGS: Right thyroid lobe

Measurements: 6.9 x 3.2 x 4.2 cm.

1. Dominant mixed cystic and solid nodule occupying the majority of
the right gland measures 6.0 x 2.9 x 4.0 cm (previously 6.4 x 3.5 x
4.2 cm).
Left thyroid lobe

Measurements: 4.0 x 1.4 x 1.5 cm.

1. Heterogeneous mixed cystic and solid but predominantly solid
nodule in the left mid gland measures 1.4 x 1.0 x 1.3 cm (previously
1.7 x 0.8 x 1.0 cm).

Isthmus

Thickness: 0.2 cm.  No nodules visualized.

Lymphadenopathy

None visualized.
IMPRESSION: 1. Stable to slightly decreased size of the dominant mixed cystic
and solid nodule occupying the majority of the right thyroid gland.
2. Stable to slightly decreased size of these solitary nodule in the
left mid gland.

## 2016-09-19 ENCOUNTER — Other Ambulatory Visit: Payer: Self-pay | Admitting: Family

## 2016-09-19 DIAGNOSIS — I10 Essential (primary) hypertension: Secondary | ICD-10-CM

## 2016-09-23 DIAGNOSIS — M79662 Pain in left lower leg: Secondary | ICD-10-CM | POA: Diagnosis not present

## 2016-09-23 DIAGNOSIS — I739 Peripheral vascular disease, unspecified: Secondary | ICD-10-CM | POA: Diagnosis not present

## 2016-09-23 DIAGNOSIS — L603 Nail dystrophy: Secondary | ICD-10-CM | POA: Diagnosis not present

## 2016-09-28 ENCOUNTER — Ambulatory Visit (INDEPENDENT_AMBULATORY_CARE_PROVIDER_SITE_OTHER): Payer: Medicare Other | Admitting: Family

## 2016-09-28 ENCOUNTER — Other Ambulatory Visit (INDEPENDENT_AMBULATORY_CARE_PROVIDER_SITE_OTHER): Payer: Medicare Other

## 2016-09-28 ENCOUNTER — Encounter: Payer: Self-pay | Admitting: Family

## 2016-09-28 VITALS — BP 118/70 | HR 85 | Temp 98.7°F | Resp 16 | Ht 66.5 in | Wt 143.8 lb

## 2016-09-28 DIAGNOSIS — I10 Essential (primary) hypertension: Secondary | ICD-10-CM

## 2016-09-28 DIAGNOSIS — Z23 Encounter for immunization: Secondary | ICD-10-CM | POA: Diagnosis not present

## 2016-09-28 DIAGNOSIS — I6529 Occlusion and stenosis of unspecified carotid artery: Secondary | ICD-10-CM

## 2016-09-28 DIAGNOSIS — R350 Frequency of micturition: Secondary | ICD-10-CM

## 2016-09-28 LAB — COMPREHENSIVE METABOLIC PANEL
ALBUMIN: 3.5 g/dL (ref 3.5–5.2)
ALT: 7 U/L (ref 0–35)
AST: 13 U/L (ref 0–37)
Alkaline Phosphatase: 36 U/L — ABNORMAL LOW (ref 39–117)
BUN: 26 mg/dL — ABNORMAL HIGH (ref 6–23)
CO2: 24 mEq/L (ref 19–32)
Calcium: 10.2 mg/dL (ref 8.4–10.5)
Chloride: 106 mEq/L (ref 96–112)
Creatinine, Ser: 1.33 mg/dL — ABNORMAL HIGH (ref 0.40–1.20)
GFR: 39.6 mL/min — AB (ref >60.00)
GLUCOSE: 104 mg/dL — AB (ref 70–99)
POTASSIUM: 4.7 meq/L (ref 3.5–5.1)
Sodium: 135 mEq/L (ref 135–145)
TOTAL PROTEIN: 5.8 g/dL — AB (ref 6.0–8.3)
Total Bilirubin: 0.3 mg/dL (ref 0.2–1.2)

## 2016-09-28 LAB — CBC
HCT: 34.6 % — ABNORMAL LOW (ref 36.0–46.0)
HEMOGLOBIN: 11.4 g/dL — AB (ref 12.0–15.0)
MCHC: 32.9 g/dL (ref 30.0–36.0)
MCV: 89.9 fl (ref 78.0–100.0)
PLATELETS: 131 10*3/uL — AB (ref 150.0–400.0)
RBC: 3.84 Mil/uL — ABNORMAL LOW (ref 3.87–5.11)
RDW: 14.2 % (ref 11.5–15.5)
WBC: 7.9 10*3/uL (ref 4.0–10.5)

## 2016-09-28 MED ORDER — OXYBUTYNIN CHLORIDE 5 MG PO TABS: 2.5000 mg | ORAL_TABLET | Freq: Every day | ORAL | 0 refills | Status: AC

## 2016-09-28 NOTE — Progress Notes (Deleted)
Subjective:    Patient ID: Summer Rodriguez, female    DOB: 25-Apr-1921, 83 y.o.   MRN: [id number]  Chief Complaint  Patient presents with  . CPE    not fasting    HPI:  Summer Rodriguez is a 83 y.o. female who presents today for a Medicare Annual Wellness/Physical exam.    1) Health Maintenance -   Diet -   Exercise -  2) Preventative Exams / Immunizations:  Dental --  Vision --   Health Maintenance  Topic Date Due  . INFLUENZA VACCINE  08/25/2016  . TETANUS/TDAP  01/25/2017  . DEXA SCAN  Completed  . PNA vac Low Risk Adult  Completed     Immunization History  Administered Date(s) Administered  . Pneumococcal-Unspecified 01/25/2010  . Td 01/26/2007    RISK FACTORS  Tobacco History  Smoking Status  . Never Smoker  Smokeless Tobacco  . Never Used     Cardiac risk factors: {risk factors:510}.  Depression Screen  Depression screen [facility] 2/9 09/28/2016  Decreased Interest 0  Down, Depressed, Hopeless 0  PHQ - 2 Score 0     Activities of Daily Living In your present state of health, do you have any difficulty performing the following activities?:  Driving? No Managing money?  No Feeding yourself? No Getting from bed to chair? No Climbing a flight of stairs? No Preparing food and eating?: No Bathing or showering? No Getting dressed: No Getting to the toilet? No Using the toilet: No Moving around from place to place: No In the past year have you fallen or had a near fall?:No   Home Safety Has smoke detector and wears seat belts. No firearms. No excess sun exposure. Are there smokers in your home (other than you)?  No Do you feel safe at home?  Yes  Hearing Difficulties: No Do you often ask people to speak up or repeat themselves? No Do you experience ringing or noises in your ears? No  Do you have difficulty understanding soft or whispered voices? No    Cognitive Testing  Alert? Yes   Normal Appearance? Yes  Oriented to person? Yes  Place?  Yes   Time? Yes  Recall of three objects?  Yes  Can perform simple calculations? Yes  Displays appropriate judgment? Yes  Can read the correct time from a watch face? Yes  Do you feel that you have a problem with memory? No  Do you often misplace items? No   Advanced Directives have been discussed with the patient? Yes   Current Physicians/Providers and Suppliers  1. Terri Piedra, FNP - Internal Medicine  Indicate any recent Medical Services you may have received from other than [facility] providers in the past year (date may be approximate).  All answers were reviewed with the patient and necessary referrals were made:  Mauricio Po, FNP   09/28/2016    Allergies  Allergen Reactions  . Penicillins Hives and Shortness Of Breath  . Sulfur Hives and Shortness Of Breath  . Codeine Other (See Comments)    "Spaced out" / "Altered reality"     Outpatient Medications Prior to Visit  Medication Sig Dispense Refill  . amLODipine (NORVASC) 5 MG tablet TAKE ONE TABLET BY MOUTH ONCE DAILY 90 tablet 2  . amLODipine (NORVASC) 5 MG tablet TAKE ONE TABLET BY MOUTH ONCE DAILY 90 tablet 0  . lisinopril (PRINIVIL,ZESTRIL) 5 MG tablet TAKE ONE TABLET BY MOUTH ONCE DAILY 90 tablet 0  . lisinopril (PRINIVIL,ZESTRIL) 5  MG tablet TAKE ONE TABLET BY MOUTH ONCE DAILY 90 tablet 0  . naproxen sodium (ALEVE) 220 MG tablet Take 220 mg by mouth as needed.     No facility-administered medications prior to visit.      Past Medical History:  Diagnosis Date  . Allergy   . Arthritis   . Arthritis   . Basal cell carcinoma    on nose  . Hyperlipemia   . Hypertension   . Osteoporosis 05/2015   T score -3.2  . Urine incontinence      Past Surgical History:  Procedure Laterality Date  . BASAL CELL CARCINOMA EXCISION     under nose  . BREAST SURGERY  1977   Bilateral partial mastectomy-no breast cancer  . CHOLECYSTECTOMY    . OVARIAN CYST SURGERY       Family History  Problem Relation Age of  Onset  . Ovarian cancer Mother   . Cancer Mother        Ovarian/Uterine Cancer  . Thyroid disease Daughter   . Diabetes Neg Hx   . Stroke Neg Hx   . Colon cancer Neg Hx   . Rectal cancer Neg Hx   . Stomach cancer Neg Hx      Social History   Social History  . Marital status: Widowed    Spouse name: N/A  . Number of children: 2  . Years of education: 14   Occupational History  . Retired    Social History Main Topics  . Smoking status: Never Smoker  . Smokeless tobacco: Never Used  . Alcohol use No     Comment: Rare  . Drug use: No  . Sexual activity: No   Other Topics Concern  . Not on file   Social History Narrative   Regular exercise-no   Caffeine Use-yes     Review of Systems  Constitutional: Denies fever, chills, fatigue, or significant weight gain/loss. HENT: Head: Denies headache or neck pain Ears: Denies changes in hearing, ringing in ears, earache, drainage Nose: Denies discharge, stuffiness, itching, nosebleed, sinus pain Throat: Denies sore throat, hoarseness, dry mouth, sores, thrush Eyes: Denies loss/changes in vision, pain, redness, blurry/double vision, flashing lights Cardiovascular: Denies chest pain/discomfort, tightness, palpitations, shortness of breath with activity, difficulty lying down, swelling, sudden awakening with shortness of breath Respiratory: Denies shortness of breath, cough, sputum production, wheezing Gastrointestinal: Denies dysphasia, heartburn, change in appetite, nausea, change in bowel habits, rectal bleeding, constipation, diarrhea, yellow skin or eyes Genitourinary: Denies frequency, urgency, burning/pain, blood in urine, incontinence, change in urinary strength. Musculoskeletal: Denies muscle/joint pain, stiffness, back pain, redness or swelling of joints, trauma Skin: Denies rashes, lumps, itching, dryness, color changes, or hair/nail changes Neurological: Denies dizziness, fainting, seizures, weakness, numbness,  tingling, tremor Psychiatric - Denies nervousness, stress, depression or memory loss Endocrine: Denies heat or cold intolerance, sweating, frequent urination, excessive thirst, changes in appetite Hematologic: Denies ease of bruising or bleeding    Objective:     BP 118/70 (BP Location: Left Arm, Patient Position: Sitting, Cuff Size: Normal)   Pulse 85   Temp 98.7 F (37.1 C) (Oral)   Resp 16   Ht 5' 6.5" (1.689 m)   Wt 143 lb 12.8 oz (65.2 kg)   SpO2 97%   BMI 22.86 kg/m  Nursing note and vital signs reviewed.  Physical Exam     Assessment & Plan:   During the course of the visit the patient was educated and counseled about appropriate screening and preventive  services including:    {plan:19837}  Diet review for nutrition referral? Yes ____  Not Indicated _X___   Patient Instructions (the written plan) was given to the patient.  Medicare Attestation I have personally reviewed: The patient's medical and social history Their use of alcohol, tobacco or illicit drugs Their current medications and supplements The patient's functional ability including ADLs,fall risks, home safety risks, cognitive, and hearing and visual impairment Diet and physical activities Evidence for depression or mood disorders  The patient's weight, height, BMI,  have been recorded in the chart.  I have made referrals, counseling, and provided education to the patient based on review of the above and I have provided the patient with a written personalized care plan for preventive services.     Problem List Items Addressed This Visit    None    Visit Diagnoses    Need for influenza vaccination    -  Primary   Relevant Orders   Flu vaccine HIGH DOSE PF (Fluzone High dose)       I am having Ms. Swartzentruber maintain her naproxen sodium, amLODipine, lisinopril, lisinopril, amLODipine, and levothyroxine.   Meds ordered this encounter  Medications  . levothyroxine (SYNTHROID) 25 MCG tablet     Sig: Take 25 mcg by mouth daily before breakfast. Brand name     Follow-up: No Follow-up on file.   Mauricio Po, FNP

## 2016-09-28 NOTE — Patient Instructions (Addendum)
Thank you for choosing [facility].  SUMMARY AND INSTRUCTIONS:  Please continue to take your medications as prescribed.   We will try the oxybutynin to help with urination.   Monitor your blood pressure home and follow a low sodium diet.   Medication:  Your prescription(s) have been submitted to your pharmacy or been printed and provided for you. Please take as directed and contact our office if you believe you are having problem(s) with the medication(s) or have any questions.  Labs:  Please stop by the lab on the lower level of the building for your blood work. Your results will be released to [facility] (or called to you) after review, usually within 72 hours after test completion. If any changes need to be made, you will be notified at that same time.  1.) The lab is open from 7:30am to 5:30 pm Monday-Friday 2.) No appointment is necessary 3.) Fasting (if needed) is 6-8 hours after food and drink; black coffee and water are okay   Follow up:  If your symptoms worsen or fail to improve, please contact our office for further instruction, or in case of emergency go directly to the emergency room at the closest medical facility.    Overactive Bladder, Adult Overactive bladder is a group of urinary symptoms. With overactive bladder, you may suddenly feel the need to pass urine (urinate) right away. After feeling this sudden urge, you might also leak urine if you cannot get to the bathroom fast enough (urinary incontinence). These symptoms might interfere with your daily work or social activities. Overactive bladder symptoms may also wake you up at night. Overactive bladder affects the nerve signals between your bladder and your brain. Your bladder may get the signal to empty before it is full. Very sensitive muscles can also make your bladder squeeze too soon. What are the causes? Many things can cause an overactive bladder. Possible causes include:  Urinary tract  infection.  Infection of nearby tissues, such as the prostate.  Prostate enlargement.  Being pregnant with twins or more (multiples).  Surgery on the uterus or urethra.  Bladder stones, inflammation, or tumors.  Drinking too much caffeine or alcohol.  Certain medicines, especially those that you take to help your body get rid of extra fluid (diuretics) by increasing urine production.  Muscle or nerve weakness, especially from: ? A spinal cord injury. ? Stroke. ? Multiple sclerosis. ? Parkinson disease.  Diabetes. This can cause a high urine volume that fills the bladder so quickly that the normal urge to urinate is triggered very strongly.  Constipation. A buildup of too much stool can put pressure on your bladder.  What increases the risk? You may be at greater risk for overactive bladder if you:  Are an older adult.  Smoke.  Are going through menopause.  Have prostate problems.  Have a neurological disease, such as stroke, dementia, Parkinson disease, or multiple sclerosis (MS).  Eat or drink things that irritate the bladder. These include alcohol, spicy food, and caffeine.  Are overweight or obese.  What are the signs or symptoms? The signs and symptoms of an overactive bladder include:  Sudden, strong urges to urinate.  Leaking urine.  Urinating eight or more times per day.  Waking up to urinate two or more times per night.  How is this diagnosed? Your health care provider may suspect overactive bladder based on your symptoms. The health care provider will do a physical exam and take your medical history. Blood or urine  tests may also be done. For example, you might need to have a bladder function test to check how well you can hold your urine. You might also need to see a health care provider who specializes in the urinary tract (urologist). How is this treated? Treatment for overactive bladder depends on the cause of your condition and whether it is mild  or severe. Certain treatments can be done in your health care provider's office or clinic. You can also make lifestyle changes at home. Options include: Behavioral Treatments  Biofeedback. A specialist uses sensors to help you become aware of your body's signals.  Keeping a daily log of when you need to urinate and what happens after the urge. This may help you manage your condition.  Bladder training. This helps you learn to control the urge to urinate by following a schedule that directs you to urinate at regular intervals (timed voiding). At first, you might have to wait a few minutes after feeling the urge. In time, you should be able to schedule bathroom visits an hour or more apart.  Kegel exercises. These are exercises to strengthen the pelvic floor muscles, which support the bladder. Toning these muscles can help you control urination, even if your bladder muscles are overactive. A specialist will teach you how to do these exercises correctly. They require daily practice.  Weight loss. If you are obese or overweight, losing weight might relieve your symptoms of overactive bladder. Talk to your health care provider about losing weight and whether there is a specific program or method that would work best for you.  Diet change. This might help if constipation is making your overactive bladder worse. Your health care provider or a dietitian can explain ways to change what you eat to ease constipation. You might also need to consume less alcohol and caffeine or drink other fluids at different times of the day.  Stopping smoking.  Wearing pads to absorb leakage while you wait for other treatments to take effect. Physical Treatments  Electrical stimulation. Electrodes send gentle pulses of electricity to strengthen the nerves or muscles that help to control the bladder. Sometimes, the electrodes are placed outside of the body. In other cases, they might be placed inside the body (implanted).  This treatment can take several months to have an effect.  Supportive devices. Women may need a plastic device that fits into the vagina and supports the bladder (pessary). Medicines Several medicines can help treat overactive bladder and are usually used along with other treatments. Some are injected into the muscles involved in urination. Others come in pill form. Your health care provider may prescribe:  Antispasmodics. These medicines block the signals that the nerves send to the bladder. This keeps the bladder from releasing urine at the wrong time.  Tricyclic antidepressants. These types of antidepressants also relax bladder muscles.  Surgery  You may have a device implanted to help manage the nerve signals that indicate when you need to urinate.  You may have surgery to implant electrodes for electrical stimulation.  Sometimes, very severe cases of overactive bladder require surgery to change the shape of the bladder. Follow these instructions at home:  Take medicines only as directed by your health care provider.  Use any implants or a pessary as directed by your health care provider.  Make any diet or lifestyle changes that are recommended by your health care provider. These might include: ? Drinking less fluid or drinking at different times of the day. If you  need to urinate often during the night, you may need to stop drinking fluids early in the evening. ? Cutting down on caffeine or alcohol. Both can make an overactive bladder worse. Caffeine is found in coffee, tea, and sodas. ? Doing Kegel exercises to strengthen muscles. ? Losing weight if you need to. ? Eating a healthy and balanced diet to prevent constipation.  Keep a journal or log to track how much and when you drink and also when you feel the need to urinate. This will help your health care provider to monitor your condition. Contact a health care provider if:  Your symptoms do not get better after  treatment.  Your pain and discomfort are getting worse.  You have more frequent urges to urinate.  You have a fever. Get help right away if: You are not able to control your bladder at all. This information is not intended to replace advice given to you by your health care provider. Make sure you discuss any questions you have with your health care provider. Document Released: 11/07/2008 Document Revised: 06/19/2015 Document Reviewed: 06/06/2013 Elsevier Interactive Patient Education  [employer].

## 2016-09-28 NOTE — Progress Notes (Signed)
Subjective:    Patient ID: Summer Rodriguez, female    DOB: 11-Feb-1921, 83 y.o.   MRN: [id number]  Chief Complaint  Patient presents with  . CPE    not fasting    HPI:  Summer Rodriguez is a 83 y.o. female who  has a past medical history of Allergy; Arthritis; Arthritis; Basal cell carcinoma; Hyperlipemia; Hypertension; Osteoporosis (05/2015); and Urine incontinence. and presents today for a follow up office visit.  1.)  Hypertension - Currently maintained on lisinopril and amlodipine. Reports taking the medications as prescribed and denies adverse side effects or hypotensive readings. Blood pressures at home have been well controlled. Denies changes in vision, worst headache of life or new symptoms of end organ damage. Working on following a low sodium diet.   BP Readings from Last 3 Encounters:  09/28/16 118/70  12/02/15 132/80  02/10/15 138/86     2.) Urinary frequency - This is a new problem. Associated symptoms of increased urination with timing of symptoms worse at night has been going on for several weeks. Denies dysuria, fever, or flank pain. Modifying factors include reducing oral intake closer to bed. Course of symptoms have remained about the same since initial onset with a night time frequency that is severe enough to effect her ability to sleep.    Allergies  Allergen Reactions  . Penicillins Hives and Shortness Of Breath  . Sulfur Hives and Shortness Of Breath  . Codeine Other (See Comments)    "Spaced out" / "Altered reality"      Outpatient Medications Prior to Visit  Medication Sig Dispense Refill  . amLODipine (NORVASC) 5 MG tablet TAKE ONE TABLET BY MOUTH ONCE DAILY 90 tablet 0  . lisinopril (PRINIVIL,ZESTRIL) 5 MG tablet TAKE ONE TABLET BY MOUTH ONCE DAILY 90 tablet 0  . naproxen sodium (ALEVE) 220 MG tablet Take 220 mg by mouth as needed.    Marland Kitchen amLODipine (NORVASC) 5 MG tablet TAKE ONE TABLET BY MOUTH ONCE DAILY 90 tablet 2  . lisinopril (PRINIVIL,ZESTRIL) 5  MG tablet TAKE ONE TABLET BY MOUTH ONCE DAILY 90 tablet 0   No facility-administered medications prior to visit.      Past Medical History:  Diagnosis Date  . Allergy   . Arthritis   . Arthritis   . Basal cell carcinoma    on nose  . Hyperlipemia   . Hypertension   . Osteoporosis 05/2015   T score -3.2  . Urine incontinence       Review of Systems  Constitutional: Negative for chills and fever.  Eyes:       Negative for changes in vision  Respiratory: Negative for cough, chest tightness and wheezing.   Cardiovascular: Negative for chest pain, palpitations and leg swelling.  Neurological: Negative for dizziness, weakness and light-headedness.      Objective:    BP 118/70 (BP Location: Left Arm, Patient Position: Sitting, Cuff Size: Normal)   Pulse 85   Temp 98.7 F (37.1 C) (Oral)   Resp 16   Ht 5' 6.5" (1.689 m)   Wt 143 lb 12.8 oz (65.2 kg)   SpO2 97%   BMI 22.86 kg/m  Nursing note and vital signs reviewed.  Physical Exam  Constitutional: She is oriented to person, place, and time. She appears well-developed and well-nourished. No distress.  Cardiovascular: Normal rate, regular rhythm, normal heart sounds and intact distal pulses.   Pulmonary/Chest: Effort normal and breath sounds normal.  Neurological: She is alert and oriented  to person, place, and time.  Skin: Skin is warm and dry.  Psychiatric: She has a normal mood and affect. Her behavior is normal. Judgment and thought content normal.       Assessment & Plan:   Problem List Items Addressed This Visit      Cardiovascular and Mediastinum   Essential hypertension, benign - Primary    Blood pressure well controlled with current medication regimen and no adverse side effects. Continue current dosage of lisinopril and amlodipine. Encouraged to monitor blood pressure at home and follow a low sodium diet. Continue to monitor.       Relevant Orders   Comp Met (CMET) (Completed)     Other   Urinary  frequency    New onset urinary frequency especially at night. Do not suspect infection. Obtain UA to rule out. Continue with fluid reduction. Trial oxybutynin at night. Follow up if symptoms worsen or do not improve.       Relevant Orders   Urinalysis   CBC (Completed)    Other Visit Diagnoses    Need for influenza vaccination       Relevant Orders   Flu vaccine HIGH DOSE PF (Fluzone High dose) (Completed)       I am having Ms. Faulstich start on oxybutynin. I am also having her maintain her naproxen sodium, lisinopril, amLODipine, and levothyroxine.   Meds ordered this encounter  Medications  . levothyroxine (SYNTHROID) 25 MCG tablet    Sig: Take 25 mcg by mouth daily before breakfast. Brand name  . oxybutynin (DITROPAN) 5 MG tablet    Sig: Take 0.5-1 tablets (2.5-5 mg total) by mouth at bedtime.    Dispense:  30 tablet    Refill:  0    Order Specific Question:   Supervising Provider    Answer:   Pricilla Holm A [[id number]]     Follow-up: Return if symptoms worsen or fail to improve.  Mauricio Po, FNP

## 2016-09-28 NOTE — Assessment & Plan Note (Signed)
Blood pressure well controlled with current medication regimen and no adverse side effects. Continue current dosage of lisinopril and amlodipine. Encouraged to monitor blood pressure at home and follow a low sodium diet. Continue to monitor.

## 2016-09-28 NOTE — Assessment & Plan Note (Signed)
New onset urinary frequency especially at night. Do not suspect infection. Obtain UA to rule out. Continue with fluid reduction. Trial oxybutynin at night. Follow up if symptoms worsen or do not improve.

## 2016-09-29 ENCOUNTER — Encounter: Payer: Self-pay | Admitting: Family

## 2016-09-29 DIAGNOSIS — M1712 Unilateral primary osteoarthritis, left knee: Secondary | ICD-10-CM | POA: Diagnosis not present

## 2016-09-29 DIAGNOSIS — M25561 Pain in right knee: Secondary | ICD-10-CM | POA: Diagnosis not present

## 2016-09-29 DIAGNOSIS — M1711 Unilateral primary osteoarthritis, right knee: Secondary | ICD-10-CM | POA: Diagnosis not present

## 2016-09-29 DIAGNOSIS — M25562 Pain in left knee: Secondary | ICD-10-CM | POA: Diagnosis not present

## 2016-09-29 DIAGNOSIS — G8929 Other chronic pain: Secondary | ICD-10-CM | POA: Diagnosis not present

## 2016-09-29 DIAGNOSIS — M5136 Other intervertebral disc degeneration, lumbar region: Secondary | ICD-10-CM | POA: Diagnosis not present

## 2016-11-10 DIAGNOSIS — M1712 Unilateral primary osteoarthritis, left knee: Secondary | ICD-10-CM | POA: Diagnosis not present

## 2016-11-10 DIAGNOSIS — G8929 Other chronic pain: Secondary | ICD-10-CM | POA: Diagnosis not present

## 2016-11-10 DIAGNOSIS — M25552 Pain in left hip: Secondary | ICD-10-CM | POA: Diagnosis not present

## 2016-11-10 DIAGNOSIS — M25562 Pain in left knee: Secondary | ICD-10-CM | POA: Diagnosis not present

## 2016-11-10 DIAGNOSIS — M5136 Other intervertebral disc degeneration, lumbar region: Secondary | ICD-10-CM | POA: Diagnosis not present

## 2016-11-15 DIAGNOSIS — M810 Age-related osteoporosis without current pathological fracture: Secondary | ICD-10-CM | POA: Diagnosis not present

## 2016-11-18 DIAGNOSIS — H3122 Choroidal dystrophy (central areolar) (generalized) (peripapillary): Secondary | ICD-10-CM | POA: Diagnosis not present

## 2016-12-21 ENCOUNTER — Encounter: Payer: Self-pay | Admitting: Internal Medicine

## 2016-12-21 DIAGNOSIS — E7800 Pure hypercholesterolemia, unspecified: Secondary | ICD-10-CM | POA: Diagnosis not present

## 2016-12-21 DIAGNOSIS — E039 Hypothyroidism, unspecified: Secondary | ICD-10-CM | POA: Diagnosis not present

## 2016-12-21 DIAGNOSIS — I10 Essential (primary) hypertension: Secondary | ICD-10-CM | POA: Diagnosis not present

## 2016-12-21 DIAGNOSIS — M1990 Unspecified osteoarthritis, unspecified site: Secondary | ICD-10-CM | POA: Diagnosis not present

## 2016-12-28 ENCOUNTER — Other Ambulatory Visit: Payer: Self-pay | Admitting: Internal Medicine

## 2016-12-28 DIAGNOSIS — R634 Abnormal weight loss: Secondary | ICD-10-CM

## 2016-12-28 DIAGNOSIS — M1990 Unspecified osteoarthritis, unspecified site: Secondary | ICD-10-CM | POA: Diagnosis not present

## 2016-12-28 DIAGNOSIS — Z79899 Other long term (current) drug therapy: Secondary | ICD-10-CM | POA: Diagnosis not present

## 2016-12-28 DIAGNOSIS — E039 Hypothyroidism, unspecified: Secondary | ICD-10-CM | POA: Diagnosis not present

## 2017-01-01 ENCOUNTER — Other Ambulatory Visit: Payer: Self-pay | Admitting: Family

## 2017-01-01 DIAGNOSIS — I10 Essential (primary) hypertension: Secondary | ICD-10-CM

## 2017-01-04 ENCOUNTER — Other Ambulatory Visit: Payer: Self-pay | Admitting: Family

## 2017-01-04 DIAGNOSIS — I10 Essential (primary) hypertension: Secondary | ICD-10-CM

## 2017-01-05 ENCOUNTER — Other Ambulatory Visit: Payer: Medicare Other

## 2017-01-09 ENCOUNTER — Other Ambulatory Visit: Payer: Self-pay | Admitting: Family

## 2017-01-09 DIAGNOSIS — I10 Essential (primary) hypertension: Secondary | ICD-10-CM

## 2017-01-10 ENCOUNTER — Other Ambulatory Visit: Payer: Self-pay | Admitting: Family

## 2017-01-10 DIAGNOSIS — I10 Essential (primary) hypertension: Secondary | ICD-10-CM

## 2017-01-11 ENCOUNTER — Ambulatory Visit
Admission: RE | Admit: 2017-01-11 | Discharge: 2017-01-11 | Disposition: A | Discharge status: Home / Self Care | Payer: Medicare Other | Source: Ambulatory Visit | Attending: Internal Medicine | Admitting: Internal Medicine

## 2017-01-11 DIAGNOSIS — K5732 Diverticulitis of large intestine without perforation or abscess without bleeding: Secondary | ICD-10-CM | POA: Diagnosis not present

## 2017-01-11 DIAGNOSIS — R634 Abnormal weight loss: Secondary | ICD-10-CM

## 2017-01-11 DIAGNOSIS — R918 Other nonspecific abnormal finding of lung field: Secondary | ICD-10-CM | POA: Diagnosis not present

## 2017-01-11 MED ORDER — IOPAMIDOL (ISOVUE-300) INJECTION 61%
50.0000 mL | Freq: Once | INTRAVENOUS | Status: AC | PRN
  Administered 2017-01-11: 50 mL via INTRAVENOUS

## 2017-01-26 DIAGNOSIS — E039 Hypothyroidism, unspecified: Secondary | ICD-10-CM | POA: Diagnosis not present

## 2017-01-26 DIAGNOSIS — I10 Essential (primary) hypertension: Secondary | ICD-10-CM | POA: Diagnosis not present

## 2017-01-26 DIAGNOSIS — M25552 Pain in left hip: Secondary | ICD-10-CM | POA: Diagnosis not present

## 2017-01-26 DIAGNOSIS — R634 Abnormal weight loss: Secondary | ICD-10-CM | POA: Diagnosis not present

## 2017-02-10 DIAGNOSIS — M6281 Muscle weakness (generalized): Secondary | ICD-10-CM | POA: Diagnosis not present

## 2017-02-10 DIAGNOSIS — R630 Anorexia: Secondary | ICD-10-CM | POA: Diagnosis not present

## 2017-02-10 DIAGNOSIS — K5900 Constipation, unspecified: Secondary | ICD-10-CM | POA: Diagnosis not present

## 2017-02-10 DIAGNOSIS — I10 Essential (primary) hypertension: Secondary | ICD-10-CM | POA: Diagnosis not present

## 2017-02-10 DIAGNOSIS — Z0000 Encounter for general adult medical examination without abnormal findings: Secondary | ICD-10-CM | POA: Diagnosis not present

## 2017-02-10 DIAGNOSIS — G8929 Other chronic pain: Secondary | ICD-10-CM | POA: Diagnosis not present

## 2017-02-10 DIAGNOSIS — E039 Hypothyroidism, unspecified: Secondary | ICD-10-CM | POA: Diagnosis not present

## 2017-02-23 ENCOUNTER — Encounter (INDEPENDENT_AMBULATORY_CARE_PROVIDER_SITE_OTHER): Payer: Medicare Other | Admitting: Ophthalmology

## 2017-03-07 DIAGNOSIS — Z79891 Long term (current) use of opiate analgesic: Secondary | ICD-10-CM | POA: Diagnosis not present

## 2017-03-07 DIAGNOSIS — E039 Hypothyroidism, unspecified: Secondary | ICD-10-CM | POA: Diagnosis not present

## 2017-03-07 DIAGNOSIS — I10 Essential (primary) hypertension: Secondary | ICD-10-CM | POA: Diagnosis not present

## 2017-03-07 DIAGNOSIS — Z9181 History of falling: Secondary | ICD-10-CM | POA: Diagnosis not present

## 2017-03-07 DIAGNOSIS — M6281 Muscle weakness (generalized): Secondary | ICD-10-CM | POA: Diagnosis not present

## 2017-03-07 DIAGNOSIS — G894 Chronic pain syndrome: Secondary | ICD-10-CM | POA: Diagnosis not present

## 2017-03-09 DIAGNOSIS — E039 Hypothyroidism, unspecified: Secondary | ICD-10-CM | POA: Diagnosis not present

## 2017-03-09 DIAGNOSIS — Z9181 History of falling: Secondary | ICD-10-CM | POA: Diagnosis not present

## 2017-03-09 DIAGNOSIS — Z79891 Long term (current) use of opiate analgesic: Secondary | ICD-10-CM | POA: Diagnosis not present

## 2017-03-09 DIAGNOSIS — G894 Chronic pain syndrome: Secondary | ICD-10-CM | POA: Diagnosis not present

## 2017-03-09 DIAGNOSIS — I10 Essential (primary) hypertension: Secondary | ICD-10-CM | POA: Diagnosis not present

## 2017-03-09 DIAGNOSIS — M6281 Muscle weakness (generalized): Secondary | ICD-10-CM | POA: Diagnosis not present

## 2017-03-10 DIAGNOSIS — Z79891 Long term (current) use of opiate analgesic: Secondary | ICD-10-CM | POA: Diagnosis not present

## 2017-03-10 DIAGNOSIS — I10 Essential (primary) hypertension: Secondary | ICD-10-CM | POA: Diagnosis not present

## 2017-03-10 DIAGNOSIS — Z9181 History of falling: Secondary | ICD-10-CM | POA: Diagnosis not present

## 2017-03-10 DIAGNOSIS — G894 Chronic pain syndrome: Secondary | ICD-10-CM | POA: Diagnosis not present

## 2017-03-10 DIAGNOSIS — E039 Hypothyroidism, unspecified: Secondary | ICD-10-CM | POA: Diagnosis not present

## 2017-03-10 DIAGNOSIS — M6281 Muscle weakness (generalized): Secondary | ICD-10-CM | POA: Diagnosis not present

## 2017-03-15 DIAGNOSIS — Z79891 Long term (current) use of opiate analgesic: Secondary | ICD-10-CM | POA: Diagnosis not present

## 2017-03-15 DIAGNOSIS — Z9181 History of falling: Secondary | ICD-10-CM | POA: Diagnosis not present

## 2017-03-15 DIAGNOSIS — E039 Hypothyroidism, unspecified: Secondary | ICD-10-CM | POA: Diagnosis not present

## 2017-03-15 DIAGNOSIS — I10 Essential (primary) hypertension: Secondary | ICD-10-CM | POA: Diagnosis not present

## 2017-03-15 DIAGNOSIS — M6281 Muscle weakness (generalized): Secondary | ICD-10-CM | POA: Diagnosis not present

## 2017-03-15 DIAGNOSIS — G894 Chronic pain syndrome: Secondary | ICD-10-CM | POA: Diagnosis not present

## 2017-03-17 DIAGNOSIS — G894 Chronic pain syndrome: Secondary | ICD-10-CM | POA: Diagnosis not present

## 2017-03-17 DIAGNOSIS — E039 Hypothyroidism, unspecified: Secondary | ICD-10-CM | POA: Diagnosis not present

## 2017-03-17 DIAGNOSIS — Z79891 Long term (current) use of opiate analgesic: Secondary | ICD-10-CM | POA: Diagnosis not present

## 2017-03-17 DIAGNOSIS — Z9181 History of falling: Secondary | ICD-10-CM | POA: Diagnosis not present

## 2017-03-17 DIAGNOSIS — I10 Essential (primary) hypertension: Secondary | ICD-10-CM | POA: Diagnosis not present

## 2017-03-17 DIAGNOSIS — M6281 Muscle weakness (generalized): Secondary | ICD-10-CM | POA: Diagnosis not present

## 2017-03-17 DIAGNOSIS — Z0000 Encounter for general adult medical examination without abnormal findings: Secondary | ICD-10-CM | POA: Diagnosis not present

## 2017-03-17 DIAGNOSIS — K5900 Constipation, unspecified: Secondary | ICD-10-CM | POA: Diagnosis not present

## 2017-03-21 DIAGNOSIS — M6281 Muscle weakness (generalized): Secondary | ICD-10-CM | POA: Diagnosis not present

## 2017-03-21 DIAGNOSIS — I10 Essential (primary) hypertension: Secondary | ICD-10-CM | POA: Diagnosis not present

## 2017-03-21 DIAGNOSIS — E039 Hypothyroidism, unspecified: Secondary | ICD-10-CM | POA: Diagnosis not present

## 2017-03-21 DIAGNOSIS — Z9181 History of falling: Secondary | ICD-10-CM | POA: Diagnosis not present

## 2017-03-21 DIAGNOSIS — G894 Chronic pain syndrome: Secondary | ICD-10-CM | POA: Diagnosis not present

## 2017-03-21 DIAGNOSIS — Z79891 Long term (current) use of opiate analgesic: Secondary | ICD-10-CM | POA: Diagnosis not present

## 2017-03-23 DIAGNOSIS — G894 Chronic pain syndrome: Secondary | ICD-10-CM | POA: Diagnosis not present

## 2017-03-23 DIAGNOSIS — I10 Essential (primary) hypertension: Secondary | ICD-10-CM | POA: Diagnosis not present

## 2017-03-23 DIAGNOSIS — E039 Hypothyroidism, unspecified: Secondary | ICD-10-CM | POA: Diagnosis not present

## 2017-03-23 DIAGNOSIS — M6281 Muscle weakness (generalized): Secondary | ICD-10-CM | POA: Diagnosis not present

## 2017-03-23 DIAGNOSIS — Z9181 History of falling: Secondary | ICD-10-CM | POA: Diagnosis not present

## 2017-03-23 DIAGNOSIS — Z79891 Long term (current) use of opiate analgesic: Secondary | ICD-10-CM | POA: Diagnosis not present

## 2017-03-28 DIAGNOSIS — Z79891 Long term (current) use of opiate analgesic: Secondary | ICD-10-CM | POA: Diagnosis not present

## 2017-03-28 DIAGNOSIS — Z9181 History of falling: Secondary | ICD-10-CM | POA: Diagnosis not present

## 2017-03-28 DIAGNOSIS — G894 Chronic pain syndrome: Secondary | ICD-10-CM | POA: Diagnosis not present

## 2017-03-28 DIAGNOSIS — I10 Essential (primary) hypertension: Secondary | ICD-10-CM | POA: Diagnosis not present

## 2017-03-28 DIAGNOSIS — E039 Hypothyroidism, unspecified: Secondary | ICD-10-CM | POA: Diagnosis not present

## 2017-03-28 DIAGNOSIS — M6281 Muscle weakness (generalized): Secondary | ICD-10-CM | POA: Diagnosis not present

## 2017-03-30 DIAGNOSIS — I10 Essential (primary) hypertension: Secondary | ICD-10-CM | POA: Diagnosis not present

## 2017-03-30 DIAGNOSIS — G894 Chronic pain syndrome: Secondary | ICD-10-CM | POA: Diagnosis not present

## 2017-03-30 DIAGNOSIS — Z9181 History of falling: Secondary | ICD-10-CM | POA: Diagnosis not present

## 2017-03-30 DIAGNOSIS — M6281 Muscle weakness (generalized): Secondary | ICD-10-CM | POA: Diagnosis not present

## 2017-03-30 DIAGNOSIS — Z79891 Long term (current) use of opiate analgesic: Secondary | ICD-10-CM | POA: Diagnosis not present

## 2017-03-30 DIAGNOSIS — E039 Hypothyroidism, unspecified: Secondary | ICD-10-CM | POA: Diagnosis not present

## 2017-04-04 DIAGNOSIS — Z79891 Long term (current) use of opiate analgesic: Secondary | ICD-10-CM | POA: Diagnosis not present

## 2017-04-04 DIAGNOSIS — E039 Hypothyroidism, unspecified: Secondary | ICD-10-CM | POA: Diagnosis not present

## 2017-04-04 DIAGNOSIS — G894 Chronic pain syndrome: Secondary | ICD-10-CM | POA: Diagnosis not present

## 2017-04-04 DIAGNOSIS — I10 Essential (primary) hypertension: Secondary | ICD-10-CM | POA: Diagnosis not present

## 2017-04-04 DIAGNOSIS — M6281 Muscle weakness (generalized): Secondary | ICD-10-CM | POA: Diagnosis not present

## 2017-04-04 DIAGNOSIS — Z9181 History of falling: Secondary | ICD-10-CM | POA: Diagnosis not present

## 2017-04-06 DIAGNOSIS — M6281 Muscle weakness (generalized): Secondary | ICD-10-CM | POA: Diagnosis not present

## 2017-04-06 DIAGNOSIS — Z79891 Long term (current) use of opiate analgesic: Secondary | ICD-10-CM | POA: Diagnosis not present

## 2017-04-06 DIAGNOSIS — Z9181 History of falling: Secondary | ICD-10-CM | POA: Diagnosis not present

## 2017-04-06 DIAGNOSIS — E039 Hypothyroidism, unspecified: Secondary | ICD-10-CM | POA: Diagnosis not present

## 2017-04-06 DIAGNOSIS — I10 Essential (primary) hypertension: Secondary | ICD-10-CM | POA: Diagnosis not present

## 2017-04-06 DIAGNOSIS — G894 Chronic pain syndrome: Secondary | ICD-10-CM | POA: Diagnosis not present

## 2017-04-11 DIAGNOSIS — E039 Hypothyroidism, unspecified: Secondary | ICD-10-CM | POA: Diagnosis not present

## 2017-04-11 DIAGNOSIS — Z79891 Long term (current) use of opiate analgesic: Secondary | ICD-10-CM | POA: Diagnosis not present

## 2017-04-11 DIAGNOSIS — I10 Essential (primary) hypertension: Secondary | ICD-10-CM | POA: Diagnosis not present

## 2017-04-11 DIAGNOSIS — Z9181 History of falling: Secondary | ICD-10-CM | POA: Diagnosis not present

## 2017-04-11 DIAGNOSIS — M6281 Muscle weakness (generalized): Secondary | ICD-10-CM | POA: Diagnosis not present

## 2017-04-11 DIAGNOSIS — G894 Chronic pain syndrome: Secondary | ICD-10-CM | POA: Diagnosis not present

## 2017-04-13 DIAGNOSIS — M6281 Muscle weakness (generalized): Secondary | ICD-10-CM | POA: Diagnosis not present

## 2017-04-13 DIAGNOSIS — Z79891 Long term (current) use of opiate analgesic: Secondary | ICD-10-CM | POA: Diagnosis not present

## 2017-04-13 DIAGNOSIS — E039 Hypothyroidism, unspecified: Secondary | ICD-10-CM | POA: Diagnosis not present

## 2017-04-13 DIAGNOSIS — I10 Essential (primary) hypertension: Secondary | ICD-10-CM | POA: Diagnosis not present

## 2017-04-13 DIAGNOSIS — Z9181 History of falling: Secondary | ICD-10-CM | POA: Diagnosis not present

## 2017-04-13 DIAGNOSIS — G894 Chronic pain syndrome: Secondary | ICD-10-CM | POA: Diagnosis not present

## 2017-04-18 DIAGNOSIS — I10 Essential (primary) hypertension: Secondary | ICD-10-CM | POA: Diagnosis not present

## 2017-04-18 DIAGNOSIS — G894 Chronic pain syndrome: Secondary | ICD-10-CM | POA: Diagnosis not present

## 2017-04-18 DIAGNOSIS — Z9181 History of falling: Secondary | ICD-10-CM | POA: Diagnosis not present

## 2017-04-18 DIAGNOSIS — E039 Hypothyroidism, unspecified: Secondary | ICD-10-CM | POA: Diagnosis not present

## 2017-04-18 DIAGNOSIS — Z79891 Long term (current) use of opiate analgesic: Secondary | ICD-10-CM | POA: Diagnosis not present

## 2017-04-18 DIAGNOSIS — M6281 Muscle weakness (generalized): Secondary | ICD-10-CM | POA: Diagnosis not present

## 2017-04-20 DIAGNOSIS — G894 Chronic pain syndrome: Secondary | ICD-10-CM | POA: Diagnosis not present

## 2017-04-20 DIAGNOSIS — E039 Hypothyroidism, unspecified: Secondary | ICD-10-CM | POA: Diagnosis not present

## 2017-04-20 DIAGNOSIS — M6281 Muscle weakness (generalized): Secondary | ICD-10-CM | POA: Diagnosis not present

## 2017-04-20 DIAGNOSIS — I10 Essential (primary) hypertension: Secondary | ICD-10-CM | POA: Diagnosis not present

## 2017-04-20 DIAGNOSIS — Z9181 History of falling: Secondary | ICD-10-CM | POA: Diagnosis not present

## 2017-04-20 DIAGNOSIS — Z79891 Long term (current) use of opiate analgesic: Secondary | ICD-10-CM | POA: Diagnosis not present

## 2017-04-25 DIAGNOSIS — Z9181 History of falling: Secondary | ICD-10-CM | POA: Diagnosis not present

## 2017-04-25 DIAGNOSIS — M6281 Muscle weakness (generalized): Secondary | ICD-10-CM | POA: Diagnosis not present

## 2017-04-25 DIAGNOSIS — Z79891 Long term (current) use of opiate analgesic: Secondary | ICD-10-CM | POA: Diagnosis not present

## 2017-04-25 DIAGNOSIS — G894 Chronic pain syndrome: Secondary | ICD-10-CM | POA: Diagnosis not present

## 2017-04-25 DIAGNOSIS — I10 Essential (primary) hypertension: Secondary | ICD-10-CM | POA: Diagnosis not present

## 2017-04-25 DIAGNOSIS — E039 Hypothyroidism, unspecified: Secondary | ICD-10-CM | POA: Diagnosis not present

## 2017-04-27 DIAGNOSIS — M6281 Muscle weakness (generalized): Secondary | ICD-10-CM | POA: Diagnosis not present

## 2017-04-27 DIAGNOSIS — E039 Hypothyroidism, unspecified: Secondary | ICD-10-CM | POA: Diagnosis not present

## 2017-04-27 DIAGNOSIS — Z9181 History of falling: Secondary | ICD-10-CM | POA: Diagnosis not present

## 2017-04-27 DIAGNOSIS — I10 Essential (primary) hypertension: Secondary | ICD-10-CM | POA: Diagnosis not present

## 2017-04-27 DIAGNOSIS — Z79891 Long term (current) use of opiate analgesic: Secondary | ICD-10-CM | POA: Diagnosis not present

## 2017-04-27 DIAGNOSIS — G894 Chronic pain syndrome: Secondary | ICD-10-CM | POA: Diagnosis not present

## 2017-05-02 DIAGNOSIS — Z9181 History of falling: Secondary | ICD-10-CM | POA: Diagnosis not present

## 2017-05-02 DIAGNOSIS — I10 Essential (primary) hypertension: Secondary | ICD-10-CM | POA: Diagnosis not present

## 2017-05-02 DIAGNOSIS — M6281 Muscle weakness (generalized): Secondary | ICD-10-CM | POA: Diagnosis not present

## 2017-05-02 DIAGNOSIS — G894 Chronic pain syndrome: Secondary | ICD-10-CM | POA: Diagnosis not present

## 2017-05-02 DIAGNOSIS — Z79891 Long term (current) use of opiate analgesic: Secondary | ICD-10-CM | POA: Diagnosis not present

## 2017-05-02 DIAGNOSIS — E039 Hypothyroidism, unspecified: Secondary | ICD-10-CM | POA: Diagnosis not present

## 2017-05-04 DIAGNOSIS — E039 Hypothyroidism, unspecified: Secondary | ICD-10-CM | POA: Diagnosis not present

## 2017-05-04 DIAGNOSIS — I10 Essential (primary) hypertension: Secondary | ICD-10-CM | POA: Diagnosis not present

## 2017-05-04 DIAGNOSIS — M6281 Muscle weakness (generalized): Secondary | ICD-10-CM | POA: Diagnosis not present

## 2017-05-04 DIAGNOSIS — Z9181 History of falling: Secondary | ICD-10-CM | POA: Diagnosis not present

## 2017-05-04 DIAGNOSIS — G894 Chronic pain syndrome: Secondary | ICD-10-CM | POA: Diagnosis not present

## 2017-05-04 DIAGNOSIS — Z79891 Long term (current) use of opiate analgesic: Secondary | ICD-10-CM | POA: Diagnosis not present

## 2017-05-06 DIAGNOSIS — Z9181 History of falling: Secondary | ICD-10-CM | POA: Diagnosis not present

## 2017-05-06 DIAGNOSIS — E039 Hypothyroidism, unspecified: Secondary | ICD-10-CM | POA: Diagnosis not present

## 2017-05-06 DIAGNOSIS — M6281 Muscle weakness (generalized): Secondary | ICD-10-CM | POA: Diagnosis not present

## 2017-05-06 DIAGNOSIS — I10 Essential (primary) hypertension: Secondary | ICD-10-CM | POA: Diagnosis not present

## 2017-05-06 DIAGNOSIS — G894 Chronic pain syndrome: Secondary | ICD-10-CM | POA: Diagnosis not present

## 2017-05-10 DIAGNOSIS — G894 Chronic pain syndrome: Secondary | ICD-10-CM | POA: Diagnosis not present

## 2017-05-10 DIAGNOSIS — Z9181 History of falling: Secondary | ICD-10-CM | POA: Diagnosis not present

## 2017-05-10 DIAGNOSIS — E039 Hypothyroidism, unspecified: Secondary | ICD-10-CM | POA: Diagnosis not present

## 2017-05-10 DIAGNOSIS — I10 Essential (primary) hypertension: Secondary | ICD-10-CM | POA: Diagnosis not present

## 2017-05-10 DIAGNOSIS — M6281 Muscle weakness (generalized): Secondary | ICD-10-CM | POA: Diagnosis not present

## 2017-05-16 DIAGNOSIS — E039 Hypothyroidism, unspecified: Secondary | ICD-10-CM | POA: Diagnosis not present

## 2017-05-16 DIAGNOSIS — M6281 Muscle weakness (generalized): Secondary | ICD-10-CM | POA: Diagnosis not present

## 2017-05-16 DIAGNOSIS — G894 Chronic pain syndrome: Secondary | ICD-10-CM | POA: Diagnosis not present

## 2017-05-16 DIAGNOSIS — Z9181 History of falling: Secondary | ICD-10-CM | POA: Diagnosis not present

## 2017-05-16 DIAGNOSIS — I10 Essential (primary) hypertension: Secondary | ICD-10-CM | POA: Diagnosis not present

## 2017-05-17 DIAGNOSIS — G894 Chronic pain syndrome: Secondary | ICD-10-CM | POA: Diagnosis not present

## 2017-05-17 DIAGNOSIS — I10 Essential (primary) hypertension: Secondary | ICD-10-CM | POA: Diagnosis not present

## 2017-05-17 DIAGNOSIS — M6281 Muscle weakness (generalized): Secondary | ICD-10-CM | POA: Diagnosis not present

## 2017-05-17 DIAGNOSIS — R630 Anorexia: Secondary | ICD-10-CM | POA: Diagnosis not present

## 2017-05-18 DIAGNOSIS — E039 Hypothyroidism, unspecified: Secondary | ICD-10-CM | POA: Diagnosis not present

## 2017-05-18 DIAGNOSIS — I10 Essential (primary) hypertension: Secondary | ICD-10-CM | POA: Diagnosis not present

## 2017-05-18 DIAGNOSIS — G894 Chronic pain syndrome: Secondary | ICD-10-CM | POA: Diagnosis not present

## 2017-05-18 DIAGNOSIS — Z9181 History of falling: Secondary | ICD-10-CM | POA: Diagnosis not present

## 2017-05-18 DIAGNOSIS — M6281 Muscle weakness (generalized): Secondary | ICD-10-CM | POA: Diagnosis not present

## 2017-05-23 DIAGNOSIS — I10 Essential (primary) hypertension: Secondary | ICD-10-CM | POA: Diagnosis not present

## 2017-05-23 DIAGNOSIS — M6281 Muscle weakness (generalized): Secondary | ICD-10-CM | POA: Diagnosis not present

## 2017-05-23 DIAGNOSIS — E039 Hypothyroidism, unspecified: Secondary | ICD-10-CM | POA: Diagnosis not present

## 2017-05-23 DIAGNOSIS — Z9181 History of falling: Secondary | ICD-10-CM | POA: Diagnosis not present

## 2017-05-23 DIAGNOSIS — G894 Chronic pain syndrome: Secondary | ICD-10-CM | POA: Diagnosis not present

## 2017-05-25 DIAGNOSIS — Z9181 History of falling: Secondary | ICD-10-CM | POA: Diagnosis not present

## 2017-05-25 DIAGNOSIS — E039 Hypothyroidism, unspecified: Secondary | ICD-10-CM | POA: Diagnosis not present

## 2017-05-25 DIAGNOSIS — G894 Chronic pain syndrome: Secondary | ICD-10-CM | POA: Diagnosis not present

## 2017-05-25 DIAGNOSIS — I10 Essential (primary) hypertension: Secondary | ICD-10-CM | POA: Diagnosis not present

## 2017-05-25 DIAGNOSIS — M6281 Muscle weakness (generalized): Secondary | ICD-10-CM | POA: Diagnosis not present

## 2017-05-30 DIAGNOSIS — Z9181 History of falling: Secondary | ICD-10-CM | POA: Diagnosis not present

## 2017-05-30 DIAGNOSIS — M6281 Muscle weakness (generalized): Secondary | ICD-10-CM | POA: Diagnosis not present

## 2017-05-30 DIAGNOSIS — G894 Chronic pain syndrome: Secondary | ICD-10-CM | POA: Diagnosis not present

## 2017-05-30 DIAGNOSIS — I10 Essential (primary) hypertension: Secondary | ICD-10-CM | POA: Diagnosis not present

## 2017-05-30 DIAGNOSIS — E039 Hypothyroidism, unspecified: Secondary | ICD-10-CM | POA: Diagnosis not present

## 2017-06-01 DIAGNOSIS — E039 Hypothyroidism, unspecified: Secondary | ICD-10-CM | POA: Diagnosis not present

## 2017-06-01 DIAGNOSIS — Z9181 History of falling: Secondary | ICD-10-CM | POA: Diagnosis not present

## 2017-06-01 DIAGNOSIS — M6281 Muscle weakness (generalized): Secondary | ICD-10-CM | POA: Diagnosis not present

## 2017-06-01 DIAGNOSIS — I10 Essential (primary) hypertension: Secondary | ICD-10-CM | POA: Diagnosis not present

## 2017-06-01 DIAGNOSIS — G894 Chronic pain syndrome: Secondary | ICD-10-CM | POA: Diagnosis not present

## 2017-06-06 ENCOUNTER — Telehealth: Payer: Self-pay | Admitting: Licensed Clinical Social Worker

## 2017-06-06 DIAGNOSIS — Z9181 History of falling: Secondary | ICD-10-CM | POA: Diagnosis not present

## 2017-06-06 DIAGNOSIS — E039 Hypothyroidism, unspecified: Secondary | ICD-10-CM | POA: Diagnosis not present

## 2017-06-06 DIAGNOSIS — I10 Essential (primary) hypertension: Secondary | ICD-10-CM | POA: Diagnosis not present

## 2017-06-06 DIAGNOSIS — M6281 Muscle weakness (generalized): Secondary | ICD-10-CM | POA: Diagnosis not present

## 2017-06-06 DIAGNOSIS — G894 Chronic pain syndrome: Secondary | ICD-10-CM | POA: Diagnosis not present

## 2017-06-06 NOTE — Telephone Encounter (Signed)
Palliative Care SW spoke with patient's daughter, [name].  A home visit is scheduled for Tuesday, 06/14/17, at 1:30.

## 2017-06-08 DIAGNOSIS — I10 Essential (primary) hypertension: Secondary | ICD-10-CM | POA: Diagnosis not present

## 2017-06-08 DIAGNOSIS — Z9181 History of falling: Secondary | ICD-10-CM | POA: Diagnosis not present

## 2017-06-08 DIAGNOSIS — M6281 Muscle weakness (generalized): Secondary | ICD-10-CM | POA: Diagnosis not present

## 2017-06-08 DIAGNOSIS — G894 Chronic pain syndrome: Secondary | ICD-10-CM | POA: Diagnosis not present

## 2017-06-08 DIAGNOSIS — E039 Hypothyroidism, unspecified: Secondary | ICD-10-CM | POA: Diagnosis not present

## 2017-06-13 DIAGNOSIS — M6281 Muscle weakness (generalized): Secondary | ICD-10-CM | POA: Diagnosis not present

## 2017-06-13 DIAGNOSIS — I10 Essential (primary) hypertension: Secondary | ICD-10-CM | POA: Diagnosis not present

## 2017-06-13 DIAGNOSIS — G894 Chronic pain syndrome: Secondary | ICD-10-CM | POA: Diagnosis not present

## 2017-06-13 DIAGNOSIS — Z9181 History of falling: Secondary | ICD-10-CM | POA: Diagnosis not present

## 2017-06-13 DIAGNOSIS — E039 Hypothyroidism, unspecified: Secondary | ICD-10-CM | POA: Diagnosis not present

## 2017-06-14 ENCOUNTER — Other Ambulatory Visit: Payer: PRIVATE HEALTH INSURANCE | Admitting: Licensed Clinical Social Worker

## 2017-06-14 ENCOUNTER — Other Ambulatory Visit: Payer: PRIVATE HEALTH INSURANCE | Admitting: *Deleted

## 2017-06-14 DIAGNOSIS — Z515 Encounter for palliative care: Secondary | ICD-10-CM

## 2017-06-14 NOTE — Progress Notes (Signed)
COMMUNITY PALLIATIVE CARE SW NOTE  PATIENT NAME: LITZI BINNING DOB: 11-Jul-1921 MRN: [id number]  PRIMARY CARE PROVIDER: Jani Gravel, MD  RESPONSIBLE PARTY:  Acct ID - Guarantor Home Phone Work Phone Relationship Acct Type  [id number] Lucrezia [phone]  Self P/F     [place], [place], [place] [address]     PLAN OF CARE and INTERVENTIONS:             1. GOALS OF CARE/ ADVANCE CARE PLANNING:  For patient to remain in her home and as independent as possible. 2. SOCIAL/EMOTIONAL/SPIRITUAL ASSESSMENT/ INTERVENTIONS:  Patient is a 84 y/o widowed caucasian female who resides alone in her home.  She has private caregivers from 10a-6p seven days per week.  Patient has a daughter, [name], who lives in [place], and another daughter who lives in [place], [place].  She has been in her current home since 1966.  SW and Palliative Care RN, Daryl Eastern, met with patient and [name].  Patient stated her left hip and knee caused discomfort with relief from medication.  Patient reported her mood was good and that she looks forward to the future.  Renee informed SW and RN separately that she thought patient was depressed, but will not inform anyone.  Patient is a member of [employer]. 3. PATIENT/CAREGIVER EDUCATION/ COPING:  Provided education regarding the Palliative Care program.  Patient and her daughter stated they understood.  Patient has compensated for her cognitive deficits well. 4. PERSONAL EMERGENCY PLAN:  Patient calls her daughter, [name], for all issues.  She also has a Customer service manager. 5. COMMUNITY RESOURCES COORDINATION/ HEALTH CARE NAVIGATION:  None requested per patient and daughter. 6. FINANCIAL/LEGAL CONCERNS/INTERVENTIONS:  None per patient and daughter.     SOCIAL HX:  Social History   Tobacco Use  . Smoking status: Never Smoker  . Smokeless tobacco: Never Used  Substance Use Topics  . Alcohol use: No    Comment: Rare    CODE STATUS: DNR ADVANCED DIRECTIVES:   Yes MOST FORM COMPLETE:  Yes HOSPICE EDUCATION PROVIDED:  No  PPS:  Patient's intake is normal.  She is able to stand and use her walker independently. Duration of visit and documentation:  90 minutes.      Creola Corn Yuvin Bussiere, LCSW

## 2017-06-15 ENCOUNTER — Encounter: Payer: Self-pay | Admitting: *Deleted

## 2017-06-15 NOTE — Progress Notes (Signed)
COMMUNITY PALLIATIVE CARE RN NOTE  PATIENT NAME: Summer Rodriguez DOB: 1921-06-29 MRN: [id number]  PRIMARY CARE PROVIDER: Jani Gravel, MD  RESPONSIBLE PARTY:  Acct ID - Guarantor Home Phone Work Phone Relationship Acct Type  [id number] SARELY, [phone]  Self P/F     [place], [place], [place] [address]   PLAN OF CARE and INTERVENTION:  1. ADVANCE CARE PLANNING/GOALS OF CARE: Avoid hospitalizations, remain at home with hired caregivers, travel outside of her home more often 2. PATIENT/CAREGIVER EDUCATION: Explained Palliative care services, Reinforced Safety/Fall Precautions 3. DISEASE STATUS: Joint visit made with Palliative Care SW. Daughter present during visit. Patient lying in bed awake and alert. Able to answer questions, however some information given inaccurate per daughter. Patient reports pain 4/10 in her L hip which radiates to L knee and L toe. Patient taking Hydrocodone and Aleve every 4 hours which takes the edge off and decreases pain rating to 2/10. Also utilizes Lidocaine patches, Biofreeze and a heating pad which are all helpful in controlling pain. Patient states that she was able to ambulate through the house today. Ambulates using her walker and 1 person assistance. Also requires assistance with bathing and dressing. Has shower bench along with grab bars in the bathroom. Also has a bedside commode, however refuses to utilize this. Recommended a hospital style bed to help patient be able to sit up in bed without difficulty, however she refuses this as well. Has hired caregivers from [facility]. Patient also has a Hospital doctor along with an Echo Dot where daughter is able to see patient when she is not there. Also patient is able to give commands such as turn the lights off and on and call her daughter. Intake is good per daughter, 3 meals a day plus snacks, with fair fluid intake. Drinks 1 Boost daily. Reports LBM being 2 days ago (06/12/17) Patient drinks prune  juice and daughter has to sneak Miralax in her water or patient will not take. Also takes Senna 4 times a day. Abdomen soft and non-distended with BS x 4.  Weight has stabilized and patient has gained about 3-4 lbs since January. Doesn't sleep well at night. Frequent trips made to the bathroom during the night. Caregiver feels that patient is afraid to go to sleep, fearing that she will not wake up. Takes small naps on and off throughout the day. Completed course of antibiotics this am due to a tooth infection following a tooth extraction. No breathing difficulties. Takes Allegra daily for allergy symptoms. C/o pain and "muffled" sound in L ear. Patient thinks it is packed with ear wax. This RN did not view any wax buildup with flashlight. This RN does not have an Otoscope to assess further into ear. Will speak with NP regarding any suggestions, e.g Debrox. Patient mainly can not hear the TV even with amplifier but is able to hear this RN and others speak at a normal level. Daughter feels patient's difficulty hearing is due to the frequency of the TV. Recommended possibly trying an amplifying device to see if this will help. Daughter advised that she has been looking into purchasing one for patient. Continues to work with PT 2x/week on Mondays and Wednesdays and patient feels that she is making progress. Daughter often tries to get patient out of the house, but patient often refuses, even though her goal is to get outside more.  HISTORY OF PRESENT ILLNESS:  This is a 84 yo female who resides at home with  assistance from hired caregivers. She continues to be followed by Palliative Care Services to assess overall condition and assist with any symptom management needs. Next visit in 1 month  CODE STATUS: DNR  ADVANCED DIRECTIVES: Y MOST FORM: yes PPS: 40%   PHYSICAL EXAM:   VITALS: Today's Vitals   06/14/17 1605  BP: (!) 160/82  Pulse: 86  Resp: 16  SpO2: 96%  PainSc: 3   PainLoc: Back    LUNGS:  clear to auscultation  CARDIAC: Cor RRR EXTREMITIES: Trace edema in bilateral feet/ankles SKIN: Small bruise noted to L wrist, other exposed skin intact  NEURO: Alert and oriented to person/place, able to answer questions however has some inaccurate responses, ambulates with walker with assistance   (Duration of visit and documentation 90 minutes)    Daryl Eastern, RN, BSN

## 2017-06-17 DIAGNOSIS — M6281 Muscle weakness (generalized): Secondary | ICD-10-CM | POA: Diagnosis not present

## 2017-06-17 DIAGNOSIS — I10 Essential (primary) hypertension: Secondary | ICD-10-CM | POA: Diagnosis not present

## 2017-06-17 DIAGNOSIS — E039 Hypothyroidism, unspecified: Secondary | ICD-10-CM | POA: Diagnosis not present

## 2017-06-17 DIAGNOSIS — Z9181 History of falling: Secondary | ICD-10-CM | POA: Diagnosis not present

## 2017-06-17 DIAGNOSIS — G894 Chronic pain syndrome: Secondary | ICD-10-CM | POA: Diagnosis not present

## 2017-06-24 DIAGNOSIS — M6281 Muscle weakness (generalized): Secondary | ICD-10-CM | POA: Diagnosis not present

## 2017-06-24 DIAGNOSIS — I10 Essential (primary) hypertension: Secondary | ICD-10-CM | POA: Diagnosis not present

## 2017-06-24 DIAGNOSIS — E039 Hypothyroidism, unspecified: Secondary | ICD-10-CM | POA: Diagnosis not present

## 2017-06-24 DIAGNOSIS — Z9181 History of falling: Secondary | ICD-10-CM | POA: Diagnosis not present

## 2017-06-24 DIAGNOSIS — G894 Chronic pain syndrome: Secondary | ICD-10-CM | POA: Diagnosis not present

## 2017-06-27 DIAGNOSIS — M6281 Muscle weakness (generalized): Secondary | ICD-10-CM | POA: Diagnosis not present

## 2017-06-27 DIAGNOSIS — Z9181 History of falling: Secondary | ICD-10-CM | POA: Diagnosis not present

## 2017-06-27 DIAGNOSIS — G894 Chronic pain syndrome: Secondary | ICD-10-CM | POA: Diagnosis not present

## 2017-06-27 DIAGNOSIS — E039 Hypothyroidism, unspecified: Secondary | ICD-10-CM | POA: Diagnosis not present

## 2017-06-27 DIAGNOSIS — I10 Essential (primary) hypertension: Secondary | ICD-10-CM | POA: Diagnosis not present

## 2017-06-29 ENCOUNTER — Other Ambulatory Visit: Payer: Self-pay | Admitting: Family

## 2017-06-29 DIAGNOSIS — I10 Essential (primary) hypertension: Secondary | ICD-10-CM | POA: Diagnosis not present

## 2017-06-29 DIAGNOSIS — Z9181 History of falling: Secondary | ICD-10-CM | POA: Diagnosis not present

## 2017-06-29 DIAGNOSIS — E039 Hypothyroidism, unspecified: Secondary | ICD-10-CM | POA: Diagnosis not present

## 2017-06-29 DIAGNOSIS — G894 Chronic pain syndrome: Secondary | ICD-10-CM | POA: Diagnosis not present

## 2017-06-29 DIAGNOSIS — M6281 Muscle weakness (generalized): Secondary | ICD-10-CM | POA: Diagnosis not present

## 2017-07-04 DIAGNOSIS — I10 Essential (primary) hypertension: Secondary | ICD-10-CM | POA: Diagnosis not present

## 2017-07-04 DIAGNOSIS — Z9181 History of falling: Secondary | ICD-10-CM | POA: Diagnosis not present

## 2017-07-04 DIAGNOSIS — M6281 Muscle weakness (generalized): Secondary | ICD-10-CM | POA: Diagnosis not present

## 2017-07-04 DIAGNOSIS — G894 Chronic pain syndrome: Secondary | ICD-10-CM | POA: Diagnosis not present

## 2017-07-04 DIAGNOSIS — E039 Hypothyroidism, unspecified: Secondary | ICD-10-CM | POA: Diagnosis not present

## 2017-07-05 DIAGNOSIS — E039 Hypothyroidism, unspecified: Secondary | ICD-10-CM | POA: Diagnosis not present

## 2017-07-05 DIAGNOSIS — G894 Chronic pain syndrome: Secondary | ICD-10-CM | POA: Diagnosis not present

## 2017-07-05 DIAGNOSIS — Z9181 History of falling: Secondary | ICD-10-CM | POA: Diagnosis not present

## 2017-07-05 DIAGNOSIS — R269 Unspecified abnormalities of gait and mobility: Secondary | ICD-10-CM | POA: Diagnosis not present

## 2017-07-05 DIAGNOSIS — I10 Essential (primary) hypertension: Secondary | ICD-10-CM | POA: Diagnosis not present

## 2017-07-05 DIAGNOSIS — K5900 Constipation, unspecified: Secondary | ICD-10-CM | POA: Diagnosis not present

## 2017-07-05 DIAGNOSIS — M6281 Muscle weakness (generalized): Secondary | ICD-10-CM | POA: Diagnosis not present

## 2017-07-06 DIAGNOSIS — I10 Essential (primary) hypertension: Secondary | ICD-10-CM | POA: Diagnosis not present

## 2017-07-06 DIAGNOSIS — M6281 Muscle weakness (generalized): Secondary | ICD-10-CM | POA: Diagnosis not present

## 2017-07-06 DIAGNOSIS — E039 Hypothyroidism, unspecified: Secondary | ICD-10-CM | POA: Diagnosis not present

## 2017-07-06 DIAGNOSIS — R269 Unspecified abnormalities of gait and mobility: Secondary | ICD-10-CM | POA: Diagnosis not present

## 2017-07-14 ENCOUNTER — Other Ambulatory Visit: Payer: Medicare Other | Admitting: Licensed Clinical Social Worker

## 2017-07-14 ENCOUNTER — Other Ambulatory Visit: Payer: Medicare Other | Admitting: *Deleted

## 2017-07-14 DIAGNOSIS — Z515 Encounter for palliative care: Secondary | ICD-10-CM

## 2017-07-14 NOTE — Progress Notes (Signed)
COMMUNITY PALLIATIVE CARE SW NOTE  PATIENT NAME: Summer Rodriguez DOB: 1921/07/23 MRN: [id number]  PRIMARY CARE PROVIDER: Jani Gravel, MD  RESPONSIBLE PARTY:  Acct ID - Guarantor Home Phone Work Phone Relationship Acct Type  [id number] Summer [phone]  Self P/F     [place], [place] [address]     PLAN OF CARE and INTERVENTIONS:             1. GOALS OF CARE/ ADVANCE CARE PLANNING:  Patient wishes to remain in her home.  Patient has a DNR and MOST form. 2. SOCIAL/EMOTIONAL/SPIRITUAL ASSESSMENT/ INTERVENTIONS:  SW and Palliative Care RN, Sandria Senter, met with patient and her daughter, Summer Rodriguez.  Patient was lying in her bed.  Renee reported patient has declined cognitively and is sleeping more.  She is able to swallow her medications.  Patient stated she still has discomfort around her hips and legs.  She said, "my memory is going".  She continues to compensate for her memory loss and will answer questions, but they are inaccurate per [facility].  SW provided active listening and supportive counseling during visit. 3. PATIENT/CAREGIVER EDUCATION/ COPING:  Continue providing Palliative Care information.  Patient copes by stating when she does not know the answer to a question.  She will also state that she walks with her walker, when in fact her daughter pushes her with the w/c. 4. PERSONAL EMERGENCY PLAN:  Patient has a Med Regulatory affairs officer. 5. COMMUNITY RESOURCES COORDINATION/ HEALTH CARE NAVIGATION:  Patient has private caregivers through [employer] during the day.  Patient also has Alexa which reminds her when to take her medications. 6. FINANCIAL/LEGAL CONCERNS/INTERVENTIONS:  None per family.     SOCIAL HX:  Social History   Tobacco Use  . Smoking status: Never Smoker  . Smokeless tobacco: Never Used  Substance Use Topics  . Alcohol use: No    Comment: Rare    CODE STATUS:  DNR  ADVANCED DIRECTIVES:  Yes MOST FORM COMPLETE:  Yes HOSPICE EDUCATION PROVIDED:  Discussed  with patient's daughter the process of when Hospice becomes involved.  She said she understood.  PPS:  Summer Rodriguez stated patient eats half of what she did three week ago.  She is able to stand independently and use her walker to go to the bathroom. Duration of visit and documentation:  90 minutes    Creola Corn Egan Sahlin, LCSW

## 2017-07-15 DIAGNOSIS — Z9181 History of falling: Secondary | ICD-10-CM | POA: Diagnosis not present

## 2017-07-15 DIAGNOSIS — R269 Unspecified abnormalities of gait and mobility: Secondary | ICD-10-CM | POA: Diagnosis not present

## 2017-07-15 DIAGNOSIS — E039 Hypothyroidism, unspecified: Secondary | ICD-10-CM | POA: Diagnosis not present

## 2017-07-15 DIAGNOSIS — G894 Chronic pain syndrome: Secondary | ICD-10-CM | POA: Diagnosis not present

## 2017-07-15 DIAGNOSIS — I10 Essential (primary) hypertension: Secondary | ICD-10-CM | POA: Diagnosis not present

## 2017-07-15 DIAGNOSIS — M6281 Muscle weakness (generalized): Secondary | ICD-10-CM | POA: Diagnosis not present

## 2017-07-15 NOTE — Progress Notes (Signed)
COMMUNITY PALLIATIVE CARE RN NOTE  PATIENT NAME: Summer Rodriguez DOB: Dec 07, 1921 MRN: [id number]  PRIMARY CARE PROVIDER: Jani Gravel, MD  RESPONSIBLE PARTY:  Acct ID - Guarantor Home Phone Work Phone Relationship Acct Type  [id number] ALLYIAH, [phone]  Self P/F     [place], [place], [place] [address]    PLAN OF CARE and INTERVENTION:  1. ADVANCE CARE PLANNING/GOALS OF CARE: Remain at home with hired caregivers and avoid hospitalizations 2. PATIENT/CAREGIVER EDUCATION: Reinforced Safety/Fall Precautions and Pain Management 3. DISEASE STATUS: Joint visit made with [place]. Daughter present during visit. Patient lying in bed upon arrival awake and alert. Daughter reports that patient has declined since visit last month. Patient is more confused, forgetful and speech more difficult to understand at times as it is slightly slurred. Patient also spending more time lying in bed and not interested in getting up out of bed. Patient continues to work with PT 2 days/week. There are 3 more weeks left of PT. Daughter states patient is not progressing as much as she had been at start of therapy. Yesterday daughter was able to get patient to ambulate using her walker into the living room area x 2. Today patient required transport via wheelchair into the dining area for lunch d/t increased weakness. Patient does ambulate a short distance using her walker into her bathroom in her room. Requires 1 person assistance with bathing and dressing. Intake is deteriorating. Patient is eating 50% less at meals than she did a month ago. Drinks 1 nutritional supplement per day but caregiver does try to encourage patient to drink 2. Patient has had a long history of constipation. Daughter giving Senna S 4x/day along with Miralax which has been effective. Last BM on 07/13/17. Patient reports that she does experiencing pain mainly in her L hip, radiating down her L leg and across her pelvis.  Currently taking Norco 1/2 tab every 6 hours and Ibuprofen BID scheduled which patient feels does help. Patient continues with caregivers from 10a-6p 7 days a week and daughter also visits often throughout the week. Patient was seen by Dr. Altamese Dilling nurse last week. Amlodopine has been increased to 10mg  daily. Patient continues on Lisinopril 5mg  daily. Next visit will be in 3 weeks. Daughter monitors BPs daily.    HISTORY OF PRESENT ILLNESS:  This is a 84 yo female who currently resides in her home with hired caregivers. Patient continues to be followed by Palliative Care. Next visit scheduled in 1 month.   CODE STATUS: DNR ADVANCED DIRECTIVES: Y MOST FORM: yes PPS: 40%  PHYSICAL EXAM:   VITALS: Today's Vitals   07/14/17 1353  BP: (!) 154/82  Pulse: 73  Resp: 16  SpO2: 93%  PainSc: 2   PainLoc: Back    LUNGS: clear to auscultation  CARDIAC: Cor RRR, + murmur EXTREMITIES: Trace edema noted to bilateral ankles SKIN: Exposed skin dry and intact, but thin and bruises easily  NEURO: Alert and oriented to person/place, increased confusion, generalized weakness, ambulates with walker   (Duration of visit and documentation 90 minutes)    Daryl Eastern, RN, BSN

## 2017-07-18 DIAGNOSIS — G894 Chronic pain syndrome: Secondary | ICD-10-CM | POA: Diagnosis not present

## 2017-07-18 DIAGNOSIS — Z9181 History of falling: Secondary | ICD-10-CM | POA: Diagnosis not present

## 2017-07-18 DIAGNOSIS — R269 Unspecified abnormalities of gait and mobility: Secondary | ICD-10-CM | POA: Diagnosis not present

## 2017-07-18 DIAGNOSIS — E039 Hypothyroidism, unspecified: Secondary | ICD-10-CM | POA: Diagnosis not present

## 2017-07-18 DIAGNOSIS — I10 Essential (primary) hypertension: Secondary | ICD-10-CM | POA: Diagnosis not present

## 2017-07-18 DIAGNOSIS — M6281 Muscle weakness (generalized): Secondary | ICD-10-CM | POA: Diagnosis not present

## 2017-07-21 DIAGNOSIS — E039 Hypothyroidism, unspecified: Secondary | ICD-10-CM | POA: Diagnosis not present

## 2017-07-21 DIAGNOSIS — Z9181 History of falling: Secondary | ICD-10-CM | POA: Diagnosis not present

## 2017-07-21 DIAGNOSIS — M6281 Muscle weakness (generalized): Secondary | ICD-10-CM | POA: Diagnosis not present

## 2017-07-21 DIAGNOSIS — G894 Chronic pain syndrome: Secondary | ICD-10-CM | POA: Diagnosis not present

## 2017-07-21 DIAGNOSIS — I10 Essential (primary) hypertension: Secondary | ICD-10-CM | POA: Diagnosis not present

## 2017-07-21 DIAGNOSIS — R269 Unspecified abnormalities of gait and mobility: Secondary | ICD-10-CM | POA: Diagnosis not present

## 2017-07-25 DIAGNOSIS — Z9181 History of falling: Secondary | ICD-10-CM | POA: Diagnosis not present

## 2017-07-25 DIAGNOSIS — I10 Essential (primary) hypertension: Secondary | ICD-10-CM | POA: Diagnosis not present

## 2017-07-25 DIAGNOSIS — R269 Unspecified abnormalities of gait and mobility: Secondary | ICD-10-CM | POA: Diagnosis not present

## 2017-07-25 DIAGNOSIS — G894 Chronic pain syndrome: Secondary | ICD-10-CM | POA: Diagnosis not present

## 2017-07-25 DIAGNOSIS — E039 Hypothyroidism, unspecified: Secondary | ICD-10-CM | POA: Diagnosis not present

## 2017-07-25 DIAGNOSIS — M6281 Muscle weakness (generalized): Secondary | ICD-10-CM | POA: Diagnosis not present

## 2017-07-27 DIAGNOSIS — Z9181 History of falling: Secondary | ICD-10-CM | POA: Diagnosis not present

## 2017-07-27 DIAGNOSIS — E039 Hypothyroidism, unspecified: Secondary | ICD-10-CM | POA: Diagnosis not present

## 2017-07-27 DIAGNOSIS — M6281 Muscle weakness (generalized): Secondary | ICD-10-CM | POA: Diagnosis not present

## 2017-07-27 DIAGNOSIS — G894 Chronic pain syndrome: Secondary | ICD-10-CM | POA: Diagnosis not present

## 2017-07-27 DIAGNOSIS — I10 Essential (primary) hypertension: Secondary | ICD-10-CM | POA: Diagnosis not present

## 2017-07-27 DIAGNOSIS — R269 Unspecified abnormalities of gait and mobility: Secondary | ICD-10-CM | POA: Diagnosis not present

## 2017-08-05 DIAGNOSIS — I10 Essential (primary) hypertension: Secondary | ICD-10-CM | POA: Diagnosis not present

## 2017-08-05 DIAGNOSIS — K5900 Constipation, unspecified: Secondary | ICD-10-CM | POA: Diagnosis not present

## 2017-08-05 DIAGNOSIS — G894 Chronic pain syndrome: Secondary | ICD-10-CM | POA: Diagnosis not present

## 2017-08-05 DIAGNOSIS — M6281 Muscle weakness (generalized): Secondary | ICD-10-CM | POA: Diagnosis not present

## 2017-08-05 DIAGNOSIS — R630 Anorexia: Secondary | ICD-10-CM | POA: Diagnosis not present

## 2017-08-10 ENCOUNTER — Other Ambulatory Visit: Payer: Medicare Other | Admitting: Licensed Clinical Social Worker

## 2017-08-10 ENCOUNTER — Other Ambulatory Visit: Payer: Medicare Other | Admitting: *Deleted

## 2017-08-10 DIAGNOSIS — Z515 Encounter for palliative care: Secondary | ICD-10-CM

## 2017-08-11 NOTE — Progress Notes (Signed)
COMMUNITY PALLIATIVE CARE RN NOTE  PATIENT NAME: Summer Rodriguez DOB: March 12, 1921 MRN: [id number]  PRIMARY CARE PROVIDER: Jani Gravel, MD  RESPONSIBLE PARTY:  Acct ID - Guarantor Home Phone Work Phone Relationship Acct Type  [id number] [name], [name]  Self P/F     [place], [place], [place] [address]    PLAN OF CARE and INTERVENTION:  1. ADVANCE CARE PLANNING/GOALS OF CARE: Remain at home with assistance from caregivers, Avoid hospitalizations 2. PATIENT/CAREGIVER EDUCATION: Reinforced Safety/Fall Precautions, Pain Management 3. DISEASE STATUS: Joint visit made with Palliative SW Summer Rodriguez. Daughter Summer Rodriguez present during visit. Daughter contacted this RN yesterday stating that patient continues to steadily decline, but has been more rapid over the past week and requesting a visit this week. Patient was seen by PCPs RN recently. Daughter concerned regarding patient's decreased intake. An appetite stimulant was ordered, however daughter reports that it seemed to affect patient cognitively so it was discontinued. This past Sunday, patient took all of her medications at one time that was in separate medication cups that was supposed to be taken every 6 hours, one medication being Hydrocodone (equalling 10 mg). Usually takes 2.5mg  every 6 hours Patient used to be able to take medications independently with reminders from [place]. Hired caregivers have been increased and now visit from 10a-6p and 10p-6a 7 days/week and now administer all medications. Patient more confused and speech difficult to understand at times during conversation. Affect more flat this visit. Patient able to answer questions, however responses are inaccurate. Progressive weakness. Patient used to be able to ambulate using her walker to the other side of her house and is now having difficulties just pulling herself up in bed. PT discontinued services since patient was not progressing and started refusing to get out of bed,  where she now spends the majority of her day. Patient will attempt to get up at times, but is not safe to ambulate. Was able to get patient to agree to having a hospital bed ordered and utilize her bedside commode during the time that caregivers are not present. Requires assistance with bathing, dressing and toileting. Daughter also states that at times patient requires assistance feeding herself, which is new. Intake at this point mainly bites and sips. Occasionally patient will eat more, but overall is eating about 50-75% less than what she used to. Has nutritional supplements but states that she is having difficulty swallowing. Patient feels they are too thick, but swallows water ok. Norvasc has been changed to Qam vs being given in the evenings and BPs have improved. Intermittently incontinent of bowel and bladder. Overall, patient continues to decline overall and daughter feels that patient will not survive past December. Discussed hospice services and advised that with continual decline, patient now seems appropriate. Daughter agreeable. Advised that this RN will start process by speaking with Palliative MD to inform of decline, and if he agrees then will obtain verbal order from PCP for Hospice Referral.   HISTORY OF PRESENT ILLNESS:  This is a 84 year old female who resides in her home with assistance of hired caregivers. I feel that patient is now appropriate for hospice services so will initiate referral process.  CODE STATUS: DNR ADVANCED DIRECTIVES: Y MOST FORM: yes PPS: 30%   PHYSICAL EXAM:   VITALS: Today's Vitals   08/10/17 1604  BP: 130/72  Pulse: 88  Resp: 16  SpO2: 93%  PainSc: 2   PainLoc: Back    LUNGS: clear to auscultation  CARDIAC: Cor RRR  EXTREMITIES: No edema SKIN: Exposed skin dry and intact  NEURO: Alert to person/place, intermittent confusion, increased generalized weakness, requires assistance with all ADLs   (Duration of visit and documentation 90  minutes)    Daryl Eastern, RN, BSN

## 2017-08-11 NOTE — Progress Notes (Signed)
COMMUNITY PALLIATIVE CARE SW NOTE  PATIENT NAME: Summer Rodriguez DOB: 03-29-1921 MRN: [id number]  PRIMARY CARE PROVIDER: Jani Gravel, MD  RESPONSIBLE PARTY:  Acct ID - Guarantor Home Phone Work Phone Relationship Acct Type  [id number] Summer [phone]  Self P/F     [place], [place], [place] [address]     PLAN OF CARE and INTERVENTIONS:             1. GOALS OF CARE/ ADVANCE CARE PLANNING:  Patient wants to remain in her home.  She has a DNR and MOST form. 2. SOCIAL/EMOTIONAL/SPIRITUAL ASSESSMENT/ INTERVENTIONS:  SW and Palliative Care RN, Daryl Eastern, met with patient and her daughter, Summer Rodriguez.  Patient was lying in bed during visit, and appeared to struggle when sitting up.  Her affect was flat, her verbalizations were less and she appeared more confused.  Explored patient using a hospital bed and she agreed.  Summer Rodriguez reported patient has been taking her medication incorrectly and has observed a steady decline. 3. PATIENT/CAREGIVER EDUCATION/ COPING:  Provided education regarding Hospice per daughter's request.  Patient and daughter both cope by expressing themselves. 4. PERSONAL EMERGENCY PLAN:  Patient has a Med Regulatory affairs officer. 5. COMMUNITY RESOURCES COORDINATION/ HEALTH CARE NAVIGATION:  Summer Rodriguez has increased patient's caregivers from 10a-6p and 10p-6a. 6. FINANCIAL/LEGAL CONCERNS/INTERVENTIONS:  None per daughter.     SOCIAL HX:  Social History   Tobacco Use  . Smoking status: Never Smoker  . Smokeless tobacco: Never Used  Substance Use Topics  . Alcohol use: No    Comment: Rare    CODE STATUS:  DNR  ADVANCED DIRECTIVES:  Yes MOST FORM COMPLETE:  Yes HOSPICE EDUCATION PROVIDED:   Provided education regarding Hospice. PPS:  Patient's appetite has decreased.  Patient is no longer able to ambulate without assistance. Duration of visit and documentation:  75 minutes.      Creola Corn Zlaty Alexa, LCSW

## 2017-08-13 DIAGNOSIS — E46 Unspecified protein-calorie malnutrition: Secondary | ICD-10-CM | POA: Diagnosis not present

## 2017-08-13 DIAGNOSIS — E039 Hypothyroidism, unspecified: Secondary | ICD-10-CM | POA: Diagnosis not present

## 2017-08-13 DIAGNOSIS — I10 Essential (primary) hypertension: Secondary | ICD-10-CM | POA: Diagnosis not present

## 2017-08-15 DIAGNOSIS — I10 Essential (primary) hypertension: Secondary | ICD-10-CM | POA: Diagnosis not present

## 2017-08-15 DIAGNOSIS — E039 Hypothyroidism, unspecified: Secondary | ICD-10-CM | POA: Diagnosis not present

## 2017-08-15 DIAGNOSIS — E46 Unspecified protein-calorie malnutrition: Secondary | ICD-10-CM | POA: Diagnosis not present

## 2017-08-16 DIAGNOSIS — E46 Unspecified protein-calorie malnutrition: Secondary | ICD-10-CM | POA: Diagnosis not present

## 2017-08-16 DIAGNOSIS — I10 Essential (primary) hypertension: Secondary | ICD-10-CM | POA: Diagnosis not present

## 2017-08-16 DIAGNOSIS — E039 Hypothyroidism, unspecified: Secondary | ICD-10-CM | POA: Diagnosis not present

## 2017-08-19 DIAGNOSIS — E46 Unspecified protein-calorie malnutrition: Secondary | ICD-10-CM | POA: Diagnosis not present

## 2017-08-19 DIAGNOSIS — E039 Hypothyroidism, unspecified: Secondary | ICD-10-CM | POA: Diagnosis not present

## 2017-08-19 DIAGNOSIS — I10 Essential (primary) hypertension: Secondary | ICD-10-CM | POA: Diagnosis not present

## 2017-08-24 DIAGNOSIS — E039 Hypothyroidism, unspecified: Secondary | ICD-10-CM | POA: Diagnosis not present

## 2017-08-24 DIAGNOSIS — I10 Essential (primary) hypertension: Secondary | ICD-10-CM | POA: Diagnosis not present

## 2017-08-24 DIAGNOSIS — E46 Unspecified protein-calorie malnutrition: Secondary | ICD-10-CM | POA: Diagnosis not present

## 2017-08-25 DIAGNOSIS — E039 Hypothyroidism, unspecified: Secondary | ICD-10-CM | POA: Diagnosis not present

## 2017-08-25 DIAGNOSIS — E46 Unspecified protein-calorie malnutrition: Secondary | ICD-10-CM | POA: Diagnosis not present

## 2017-08-25 DIAGNOSIS — I10 Essential (primary) hypertension: Secondary | ICD-10-CM | POA: Diagnosis not present

## 2017-08-31 DIAGNOSIS — E039 Hypothyroidism, unspecified: Secondary | ICD-10-CM | POA: Diagnosis not present

## 2017-08-31 DIAGNOSIS — E46 Unspecified protein-calorie malnutrition: Secondary | ICD-10-CM | POA: Diagnosis not present

## 2017-08-31 DIAGNOSIS — I10 Essential (primary) hypertension: Secondary | ICD-10-CM | POA: Diagnosis not present

## 2017-09-02 DIAGNOSIS — I10 Essential (primary) hypertension: Secondary | ICD-10-CM | POA: Diagnosis not present

## 2017-09-02 DIAGNOSIS — E039 Hypothyroidism, unspecified: Secondary | ICD-10-CM | POA: Diagnosis not present

## 2017-09-02 DIAGNOSIS — E46 Unspecified protein-calorie malnutrition: Secondary | ICD-10-CM | POA: Diagnosis not present

## 2017-09-06 DIAGNOSIS — G894 Chronic pain syndrome: Secondary | ICD-10-CM | POA: Diagnosis not present

## 2017-09-06 DIAGNOSIS — M6281 Muscle weakness (generalized): Secondary | ICD-10-CM | POA: Diagnosis not present

## 2017-09-06 DIAGNOSIS — N390 Urinary tract infection, site not specified: Secondary | ICD-10-CM | POA: Diagnosis not present

## 2017-09-06 DIAGNOSIS — I10 Essential (primary) hypertension: Secondary | ICD-10-CM | POA: Diagnosis not present

## 2017-09-07 DIAGNOSIS — E46 Unspecified protein-calorie malnutrition: Secondary | ICD-10-CM | POA: Diagnosis not present

## 2017-09-07 DIAGNOSIS — E039 Hypothyroidism, unspecified: Secondary | ICD-10-CM | POA: Diagnosis not present

## 2017-09-07 DIAGNOSIS — I10 Essential (primary) hypertension: Secondary | ICD-10-CM | POA: Diagnosis not present

## 2017-09-13 DIAGNOSIS — E46 Unspecified protein-calorie malnutrition: Secondary | ICD-10-CM | POA: Diagnosis not present

## 2017-09-13 DIAGNOSIS — E039 Hypothyroidism, unspecified: Secondary | ICD-10-CM | POA: Diagnosis not present

## 2017-09-13 DIAGNOSIS — I10 Essential (primary) hypertension: Secondary | ICD-10-CM | POA: Diagnosis not present

## 2017-09-14 DIAGNOSIS — E46 Unspecified protein-calorie malnutrition: Secondary | ICD-10-CM | POA: Diagnosis not present

## 2017-09-14 DIAGNOSIS — E039 Hypothyroidism, unspecified: Secondary | ICD-10-CM | POA: Diagnosis not present

## 2017-09-14 DIAGNOSIS — I10 Essential (primary) hypertension: Secondary | ICD-10-CM | POA: Diagnosis not present

## 2017-09-21 DIAGNOSIS — E039 Hypothyroidism, unspecified: Secondary | ICD-10-CM | POA: Diagnosis not present

## 2017-09-21 DIAGNOSIS — E46 Unspecified protein-calorie malnutrition: Secondary | ICD-10-CM | POA: Diagnosis not present

## 2017-09-21 DIAGNOSIS — I10 Essential (primary) hypertension: Secondary | ICD-10-CM | POA: Diagnosis not present

## 2017-09-23 DIAGNOSIS — I10 Essential (primary) hypertension: Secondary | ICD-10-CM | POA: Diagnosis not present

## 2017-09-25 DIAGNOSIS — I10 Essential (primary) hypertension: Secondary | ICD-10-CM | POA: Diagnosis not present

## 2017-09-25 DIAGNOSIS — E46 Unspecified protein-calorie malnutrition: Secondary | ICD-10-CM | POA: Diagnosis not present

## 2017-09-25 DIAGNOSIS — E039 Hypothyroidism, unspecified: Secondary | ICD-10-CM | POA: Diagnosis not present

## 2017-09-28 DIAGNOSIS — E039 Hypothyroidism, unspecified: Secondary | ICD-10-CM | POA: Diagnosis not present

## 2017-09-28 DIAGNOSIS — I10 Essential (primary) hypertension: Secondary | ICD-10-CM | POA: Diagnosis not present

## 2017-09-28 DIAGNOSIS — E46 Unspecified protein-calorie malnutrition: Secondary | ICD-10-CM | POA: Diagnosis not present

## 2017-10-05 DIAGNOSIS — I10 Essential (primary) hypertension: Secondary | ICD-10-CM | POA: Diagnosis not present

## 2017-10-05 DIAGNOSIS — E46 Unspecified protein-calorie malnutrition: Secondary | ICD-10-CM | POA: Diagnosis not present

## 2017-10-05 DIAGNOSIS — E039 Hypothyroidism, unspecified: Secondary | ICD-10-CM | POA: Diagnosis not present

## 2017-10-06 DIAGNOSIS — E46 Unspecified protein-calorie malnutrition: Secondary | ICD-10-CM | POA: Diagnosis not present

## 2017-10-06 DIAGNOSIS — I10 Essential (primary) hypertension: Secondary | ICD-10-CM | POA: Diagnosis not present

## 2017-10-06 DIAGNOSIS — E039 Hypothyroidism, unspecified: Secondary | ICD-10-CM | POA: Diagnosis not present

## 2017-10-12 DIAGNOSIS — E039 Hypothyroidism, unspecified: Secondary | ICD-10-CM | POA: Diagnosis not present

## 2017-10-12 DIAGNOSIS — E46 Unspecified protein-calorie malnutrition: Secondary | ICD-10-CM | POA: Diagnosis not present

## 2017-10-12 DIAGNOSIS — I10 Essential (primary) hypertension: Secondary | ICD-10-CM | POA: Diagnosis not present

## 2017-10-18 DIAGNOSIS — E46 Unspecified protein-calorie malnutrition: Secondary | ICD-10-CM | POA: Diagnosis not present

## 2017-10-18 DIAGNOSIS — I10 Essential (primary) hypertension: Secondary | ICD-10-CM | POA: Diagnosis not present

## 2017-10-18 DIAGNOSIS — E039 Hypothyroidism, unspecified: Secondary | ICD-10-CM | POA: Diagnosis not present

## 2017-10-19 DIAGNOSIS — E46 Unspecified protein-calorie malnutrition: Secondary | ICD-10-CM | POA: Diagnosis not present

## 2017-10-19 DIAGNOSIS — E039 Hypothyroidism, unspecified: Secondary | ICD-10-CM | POA: Diagnosis not present

## 2017-10-19 DIAGNOSIS — I10 Essential (primary) hypertension: Secondary | ICD-10-CM | POA: Diagnosis not present

## 2017-10-25 DIAGNOSIS — I10 Essential (primary) hypertension: Secondary | ICD-10-CM | POA: Diagnosis not present

## 2017-10-25 DIAGNOSIS — E46 Unspecified protein-calorie malnutrition: Secondary | ICD-10-CM | POA: Diagnosis not present

## 2017-10-25 DIAGNOSIS — E039 Hypothyroidism, unspecified: Secondary | ICD-10-CM | POA: Diagnosis not present

## 2017-10-26 DIAGNOSIS — E039 Hypothyroidism, unspecified: Secondary | ICD-10-CM | POA: Diagnosis not present

## 2017-10-26 DIAGNOSIS — E46 Unspecified protein-calorie malnutrition: Secondary | ICD-10-CM | POA: Diagnosis not present

## 2017-10-26 DIAGNOSIS — I10 Essential (primary) hypertension: Secondary | ICD-10-CM | POA: Diagnosis not present

## 2017-10-28 DIAGNOSIS — E039 Hypothyroidism, unspecified: Secondary | ICD-10-CM | POA: Diagnosis not present

## 2017-10-28 DIAGNOSIS — I10 Essential (primary) hypertension: Secondary | ICD-10-CM | POA: Diagnosis not present

## 2017-10-28 DIAGNOSIS — E46 Unspecified protein-calorie malnutrition: Secondary | ICD-10-CM | POA: Diagnosis not present

## 2017-10-31 DIAGNOSIS — I10 Essential (primary) hypertension: Secondary | ICD-10-CM | POA: Diagnosis not present

## 2017-10-31 DIAGNOSIS — E46 Unspecified protein-calorie malnutrition: Secondary | ICD-10-CM | POA: Diagnosis not present

## 2017-10-31 DIAGNOSIS — E039 Hypothyroidism, unspecified: Secondary | ICD-10-CM | POA: Diagnosis not present

## 2017-11-01 DIAGNOSIS — I10 Essential (primary) hypertension: Secondary | ICD-10-CM | POA: Diagnosis not present

## 2017-11-01 DIAGNOSIS — K5900 Constipation, unspecified: Secondary | ICD-10-CM | POA: Diagnosis not present

## 2017-11-01 DIAGNOSIS — L8991 Pressure ulcer of unspecified site, stage 1: Secondary | ICD-10-CM | POA: Diagnosis not present

## 2017-11-01 DIAGNOSIS — G894 Chronic pain syndrome: Secondary | ICD-10-CM | POA: Diagnosis not present

## 2017-11-01 DIAGNOSIS — N390 Urinary tract infection, site not specified: Secondary | ICD-10-CM | POA: Diagnosis not present

## 2017-11-01 DIAGNOSIS — E039 Hypothyroidism, unspecified: Secondary | ICD-10-CM | POA: Diagnosis not present

## 2017-11-01 DIAGNOSIS — E46 Unspecified protein-calorie malnutrition: Secondary | ICD-10-CM | POA: Diagnosis not present

## 2017-11-02 DIAGNOSIS — I10 Essential (primary) hypertension: Secondary | ICD-10-CM | POA: Diagnosis not present

## 2017-11-02 DIAGNOSIS — E039 Hypothyroidism, unspecified: Secondary | ICD-10-CM | POA: Diagnosis not present

## 2017-11-02 DIAGNOSIS — E46 Unspecified protein-calorie malnutrition: Secondary | ICD-10-CM | POA: Diagnosis not present

## 2017-11-04 DIAGNOSIS — I10 Essential (primary) hypertension: Secondary | ICD-10-CM | POA: Diagnosis not present

## 2017-11-04 DIAGNOSIS — E039 Hypothyroidism, unspecified: Secondary | ICD-10-CM | POA: Diagnosis not present

## 2017-11-04 DIAGNOSIS — E46 Unspecified protein-calorie malnutrition: Secondary | ICD-10-CM | POA: Diagnosis not present

## 2017-11-08 DIAGNOSIS — I10 Essential (primary) hypertension: Secondary | ICD-10-CM | POA: Diagnosis not present

## 2017-11-08 DIAGNOSIS — E039 Hypothyroidism, unspecified: Secondary | ICD-10-CM | POA: Diagnosis not present

## 2017-11-08 DIAGNOSIS — E46 Unspecified protein-calorie malnutrition: Secondary | ICD-10-CM | POA: Diagnosis not present

## 2017-11-09 DIAGNOSIS — E46 Unspecified protein-calorie malnutrition: Secondary | ICD-10-CM | POA: Diagnosis not present

## 2017-11-09 DIAGNOSIS — E039 Hypothyroidism, unspecified: Secondary | ICD-10-CM | POA: Diagnosis not present

## 2017-11-09 DIAGNOSIS — I10 Essential (primary) hypertension: Secondary | ICD-10-CM | POA: Diagnosis not present

## 2017-11-11 DIAGNOSIS — E46 Unspecified protein-calorie malnutrition: Secondary | ICD-10-CM | POA: Diagnosis not present

## 2017-11-11 DIAGNOSIS — E039 Hypothyroidism, unspecified: Secondary | ICD-10-CM | POA: Diagnosis not present

## 2017-11-11 DIAGNOSIS — I10 Essential (primary) hypertension: Secondary | ICD-10-CM | POA: Diagnosis not present

## 2017-11-15 DIAGNOSIS — E46 Unspecified protein-calorie malnutrition: Secondary | ICD-10-CM | POA: Diagnosis not present

## 2017-11-15 DIAGNOSIS — E039 Hypothyroidism, unspecified: Secondary | ICD-10-CM | POA: Diagnosis not present

## 2017-11-15 DIAGNOSIS — I10 Essential (primary) hypertension: Secondary | ICD-10-CM | POA: Diagnosis not present

## 2017-11-16 DIAGNOSIS — E039 Hypothyroidism, unspecified: Secondary | ICD-10-CM | POA: Diagnosis not present

## 2017-11-16 DIAGNOSIS — E46 Unspecified protein-calorie malnutrition: Secondary | ICD-10-CM | POA: Diagnosis not present

## 2017-11-16 DIAGNOSIS — I10 Essential (primary) hypertension: Secondary | ICD-10-CM | POA: Diagnosis not present

## 2017-11-18 DIAGNOSIS — E46 Unspecified protein-calorie malnutrition: Secondary | ICD-10-CM | POA: Diagnosis not present

## 2017-11-18 DIAGNOSIS — E039 Hypothyroidism, unspecified: Secondary | ICD-10-CM | POA: Diagnosis not present

## 2017-11-18 DIAGNOSIS — I10 Essential (primary) hypertension: Secondary | ICD-10-CM | POA: Diagnosis not present

## 2017-11-22 DIAGNOSIS — E46 Unspecified protein-calorie malnutrition: Secondary | ICD-10-CM | POA: Diagnosis not present

## 2017-11-22 DIAGNOSIS — E039 Hypothyroidism, unspecified: Secondary | ICD-10-CM | POA: Diagnosis not present

## 2017-11-22 DIAGNOSIS — I10 Essential (primary) hypertension: Secondary | ICD-10-CM | POA: Diagnosis not present

## 2017-11-23 DIAGNOSIS — E039 Hypothyroidism, unspecified: Secondary | ICD-10-CM | POA: Diagnosis not present

## 2017-11-23 DIAGNOSIS — I10 Essential (primary) hypertension: Secondary | ICD-10-CM | POA: Diagnosis not present

## 2017-11-23 DIAGNOSIS — E46 Unspecified protein-calorie malnutrition: Secondary | ICD-10-CM | POA: Diagnosis not present

## 2017-11-25 DIAGNOSIS — E46 Unspecified protein-calorie malnutrition: Secondary | ICD-10-CM | POA: Diagnosis not present

## 2017-11-25 DIAGNOSIS — E039 Hypothyroidism, unspecified: Secondary | ICD-10-CM | POA: Diagnosis not present

## 2017-11-25 DIAGNOSIS — I10 Essential (primary) hypertension: Secondary | ICD-10-CM | POA: Diagnosis not present

## 2017-11-26 DIAGNOSIS — E46 Unspecified protein-calorie malnutrition: Secondary | ICD-10-CM | POA: Diagnosis not present

## 2017-11-26 DIAGNOSIS — I10 Essential (primary) hypertension: Secondary | ICD-10-CM | POA: Diagnosis not present

## 2017-11-26 DIAGNOSIS — E039 Hypothyroidism, unspecified: Secondary | ICD-10-CM | POA: Diagnosis not present

## 2017-11-29 DIAGNOSIS — E46 Unspecified protein-calorie malnutrition: Secondary | ICD-10-CM | POA: Diagnosis not present

## 2017-11-29 DIAGNOSIS — E039 Hypothyroidism, unspecified: Secondary | ICD-10-CM | POA: Diagnosis not present

## 2017-11-29 DIAGNOSIS — I10 Essential (primary) hypertension: Secondary | ICD-10-CM | POA: Diagnosis not present

## 2017-11-30 DIAGNOSIS — E46 Unspecified protein-calorie malnutrition: Secondary | ICD-10-CM | POA: Diagnosis not present

## 2017-11-30 DIAGNOSIS — I10 Essential (primary) hypertension: Secondary | ICD-10-CM | POA: Diagnosis not present

## 2017-11-30 DIAGNOSIS — E039 Hypothyroidism, unspecified: Secondary | ICD-10-CM | POA: Diagnosis not present

## 2017-12-02 DIAGNOSIS — I10 Essential (primary) hypertension: Secondary | ICD-10-CM | POA: Diagnosis not present

## 2017-12-02 DIAGNOSIS — E46 Unspecified protein-calorie malnutrition: Secondary | ICD-10-CM | POA: Diagnosis not present

## 2017-12-02 DIAGNOSIS — E039 Hypothyroidism, unspecified: Secondary | ICD-10-CM | POA: Diagnosis not present

## 2017-12-06 DIAGNOSIS — I10 Essential (primary) hypertension: Secondary | ICD-10-CM | POA: Diagnosis not present

## 2017-12-06 DIAGNOSIS — E039 Hypothyroidism, unspecified: Secondary | ICD-10-CM | POA: Diagnosis not present

## 2017-12-06 DIAGNOSIS — E46 Unspecified protein-calorie malnutrition: Secondary | ICD-10-CM | POA: Diagnosis not present

## 2017-12-07 DIAGNOSIS — I10 Essential (primary) hypertension: Secondary | ICD-10-CM | POA: Diagnosis not present

## 2017-12-07 DIAGNOSIS — E46 Unspecified protein-calorie malnutrition: Secondary | ICD-10-CM | POA: Diagnosis not present

## 2017-12-07 DIAGNOSIS — E039 Hypothyroidism, unspecified: Secondary | ICD-10-CM | POA: Diagnosis not present

## 2017-12-09 DIAGNOSIS — I10 Essential (primary) hypertension: Secondary | ICD-10-CM | POA: Diagnosis not present

## 2017-12-09 DIAGNOSIS — E46 Unspecified protein-calorie malnutrition: Secondary | ICD-10-CM | POA: Diagnosis not present

## 2017-12-09 DIAGNOSIS — E039 Hypothyroidism, unspecified: Secondary | ICD-10-CM | POA: Diagnosis not present

## 2017-12-13 DIAGNOSIS — E039 Hypothyroidism, unspecified: Secondary | ICD-10-CM | POA: Diagnosis not present

## 2017-12-13 DIAGNOSIS — I10 Essential (primary) hypertension: Secondary | ICD-10-CM | POA: Diagnosis not present

## 2017-12-13 DIAGNOSIS — E46 Unspecified protein-calorie malnutrition: Secondary | ICD-10-CM | POA: Diagnosis not present

## 2017-12-14 DIAGNOSIS — I10 Essential (primary) hypertension: Secondary | ICD-10-CM | POA: Diagnosis not present

## 2017-12-14 DIAGNOSIS — E039 Hypothyroidism, unspecified: Secondary | ICD-10-CM | POA: Diagnosis not present

## 2017-12-14 DIAGNOSIS — E46 Unspecified protein-calorie malnutrition: Secondary | ICD-10-CM | POA: Diagnosis not present

## 2017-12-16 DIAGNOSIS — E46 Unspecified protein-calorie malnutrition: Secondary | ICD-10-CM | POA: Diagnosis not present

## 2017-12-16 DIAGNOSIS — E039 Hypothyroidism, unspecified: Secondary | ICD-10-CM | POA: Diagnosis not present

## 2017-12-16 DIAGNOSIS — I10 Essential (primary) hypertension: Secondary | ICD-10-CM | POA: Diagnosis not present

## 2017-12-20 DIAGNOSIS — E46 Unspecified protein-calorie malnutrition: Secondary | ICD-10-CM | POA: Diagnosis not present

## 2017-12-20 DIAGNOSIS — I10 Essential (primary) hypertension: Secondary | ICD-10-CM | POA: Diagnosis not present

## 2017-12-20 DIAGNOSIS — E039 Hypothyroidism, unspecified: Secondary | ICD-10-CM | POA: Diagnosis not present

## 2017-12-23 DIAGNOSIS — E039 Hypothyroidism, unspecified: Secondary | ICD-10-CM | POA: Diagnosis not present

## 2017-12-23 DIAGNOSIS — E46 Unspecified protein-calorie malnutrition: Secondary | ICD-10-CM | POA: Diagnosis not present

## 2017-12-23 DIAGNOSIS — I10 Essential (primary) hypertension: Secondary | ICD-10-CM | POA: Diagnosis not present

## 2017-12-25 DIAGNOSIS — E46 Unspecified protein-calorie malnutrition: Secondary | ICD-10-CM | POA: Diagnosis not present

## 2017-12-25 DIAGNOSIS — I10 Essential (primary) hypertension: Secondary | ICD-10-CM | POA: Diagnosis not present

## 2017-12-25 DIAGNOSIS — E039 Hypothyroidism, unspecified: Secondary | ICD-10-CM | POA: Diagnosis not present

## 2017-12-27 DIAGNOSIS — E46 Unspecified protein-calorie malnutrition: Secondary | ICD-10-CM | POA: Diagnosis not present

## 2017-12-27 DIAGNOSIS — E039 Hypothyroidism, unspecified: Secondary | ICD-10-CM | POA: Diagnosis not present

## 2017-12-27 DIAGNOSIS — I10 Essential (primary) hypertension: Secondary | ICD-10-CM | POA: Diagnosis not present

## 2017-12-28 DIAGNOSIS — E46 Unspecified protein-calorie malnutrition: Secondary | ICD-10-CM | POA: Diagnosis not present

## 2017-12-28 DIAGNOSIS — I10 Essential (primary) hypertension: Secondary | ICD-10-CM | POA: Diagnosis not present

## 2017-12-28 DIAGNOSIS — E039 Hypothyroidism, unspecified: Secondary | ICD-10-CM | POA: Diagnosis not present

## 2017-12-30 DIAGNOSIS — E46 Unspecified protein-calorie malnutrition: Secondary | ICD-10-CM | POA: Diagnosis not present

## 2017-12-30 DIAGNOSIS — E039 Hypothyroidism, unspecified: Secondary | ICD-10-CM | POA: Diagnosis not present

## 2017-12-30 DIAGNOSIS — I10 Essential (primary) hypertension: Secondary | ICD-10-CM | POA: Diagnosis not present

## 2018-01-03 DIAGNOSIS — E46 Unspecified protein-calorie malnutrition: Secondary | ICD-10-CM | POA: Diagnosis not present

## 2018-01-03 DIAGNOSIS — I10 Essential (primary) hypertension: Secondary | ICD-10-CM | POA: Diagnosis not present

## 2018-01-03 DIAGNOSIS — E039 Hypothyroidism, unspecified: Secondary | ICD-10-CM | POA: Diagnosis not present

## 2018-01-04 DIAGNOSIS — E46 Unspecified protein-calorie malnutrition: Secondary | ICD-10-CM | POA: Diagnosis not present

## 2018-01-04 DIAGNOSIS — I10 Essential (primary) hypertension: Secondary | ICD-10-CM | POA: Diagnosis not present

## 2018-01-04 DIAGNOSIS — E039 Hypothyroidism, unspecified: Secondary | ICD-10-CM | POA: Diagnosis not present

## 2018-01-06 DIAGNOSIS — B372 Candidiasis of skin and nail: Secondary | ICD-10-CM | POA: Diagnosis not present

## 2018-01-06 DIAGNOSIS — E46 Unspecified protein-calorie malnutrition: Secondary | ICD-10-CM | POA: Diagnosis not present

## 2018-01-06 DIAGNOSIS — M6281 Muscle weakness (generalized): Secondary | ICD-10-CM | POA: Diagnosis not present

## 2018-01-06 DIAGNOSIS — I10 Essential (primary) hypertension: Secondary | ICD-10-CM | POA: Diagnosis not present

## 2018-01-06 DIAGNOSIS — G894 Chronic pain syndrome: Secondary | ICD-10-CM | POA: Diagnosis not present

## 2018-01-06 DIAGNOSIS — K5900 Constipation, unspecified: Secondary | ICD-10-CM | POA: Diagnosis not present

## 2018-01-06 DIAGNOSIS — E039 Hypothyroidism, unspecified: Secondary | ICD-10-CM | POA: Diagnosis not present

## 2018-01-10 DIAGNOSIS — E46 Unspecified protein-calorie malnutrition: Secondary | ICD-10-CM | POA: Diagnosis not present

## 2018-01-10 DIAGNOSIS — E039 Hypothyroidism, unspecified: Secondary | ICD-10-CM | POA: Diagnosis not present

## 2018-01-10 DIAGNOSIS — I10 Essential (primary) hypertension: Secondary | ICD-10-CM | POA: Diagnosis not present

## 2018-01-13 DIAGNOSIS — E46 Unspecified protein-calorie malnutrition: Secondary | ICD-10-CM | POA: Diagnosis not present

## 2018-01-13 DIAGNOSIS — I10 Essential (primary) hypertension: Secondary | ICD-10-CM | POA: Diagnosis not present

## 2018-01-13 DIAGNOSIS — E039 Hypothyroidism, unspecified: Secondary | ICD-10-CM | POA: Diagnosis not present

## 2018-01-17 DIAGNOSIS — I10 Essential (primary) hypertension: Secondary | ICD-10-CM | POA: Diagnosis not present

## 2018-01-17 DIAGNOSIS — E46 Unspecified protein-calorie malnutrition: Secondary | ICD-10-CM | POA: Diagnosis not present

## 2018-01-17 DIAGNOSIS — E039 Hypothyroidism, unspecified: Secondary | ICD-10-CM | POA: Diagnosis not present

## 2018-01-19 DIAGNOSIS — E039 Hypothyroidism, unspecified: Secondary | ICD-10-CM | POA: Diagnosis not present

## 2018-01-19 DIAGNOSIS — I10 Essential (primary) hypertension: Secondary | ICD-10-CM | POA: Diagnosis not present

## 2018-01-19 DIAGNOSIS — E46 Unspecified protein-calorie malnutrition: Secondary | ICD-10-CM | POA: Diagnosis not present

## 2018-01-20 DIAGNOSIS — E039 Hypothyroidism, unspecified: Secondary | ICD-10-CM | POA: Diagnosis not present

## 2018-01-20 DIAGNOSIS — I10 Essential (primary) hypertension: Secondary | ICD-10-CM | POA: Diagnosis not present

## 2018-01-20 DIAGNOSIS — E46 Unspecified protein-calorie malnutrition: Secondary | ICD-10-CM | POA: Diagnosis not present

## 2018-01-23 DIAGNOSIS — I10 Essential (primary) hypertension: Secondary | ICD-10-CM | POA: Diagnosis not present

## 2018-01-23 DIAGNOSIS — E039 Hypothyroidism, unspecified: Secondary | ICD-10-CM | POA: Diagnosis not present

## 2018-01-23 DIAGNOSIS — E46 Unspecified protein-calorie malnutrition: Secondary | ICD-10-CM | POA: Diagnosis not present

## 2018-01-24 DIAGNOSIS — E46 Unspecified protein-calorie malnutrition: Secondary | ICD-10-CM | POA: Diagnosis not present

## 2018-01-24 DIAGNOSIS — E039 Hypothyroidism, unspecified: Secondary | ICD-10-CM | POA: Diagnosis not present

## 2018-01-24 DIAGNOSIS — I10 Essential (primary) hypertension: Secondary | ICD-10-CM | POA: Diagnosis not present

## 2018-01-25 DIAGNOSIS — E46 Unspecified protein-calorie malnutrition: Secondary | ICD-10-CM | POA: Diagnosis not present

## 2018-01-25 DIAGNOSIS — I10 Essential (primary) hypertension: Secondary | ICD-10-CM | POA: Diagnosis not present

## 2018-01-25 DIAGNOSIS — E039 Hypothyroidism, unspecified: Secondary | ICD-10-CM | POA: Diagnosis not present

## 2018-01-27 DIAGNOSIS — I10 Essential (primary) hypertension: Secondary | ICD-10-CM | POA: Diagnosis not present

## 2018-01-27 DIAGNOSIS — E039 Hypothyroidism, unspecified: Secondary | ICD-10-CM | POA: Diagnosis not present

## 2018-01-27 DIAGNOSIS — E46 Unspecified protein-calorie malnutrition: Secondary | ICD-10-CM | POA: Diagnosis not present

## 2018-01-31 DIAGNOSIS — E46 Unspecified protein-calorie malnutrition: Secondary | ICD-10-CM | POA: Diagnosis not present

## 2018-01-31 DIAGNOSIS — I10 Essential (primary) hypertension: Secondary | ICD-10-CM | POA: Diagnosis not present

## 2018-01-31 DIAGNOSIS — E039 Hypothyroidism, unspecified: Secondary | ICD-10-CM | POA: Diagnosis not present

## 2018-02-01 DIAGNOSIS — E039 Hypothyroidism, unspecified: Secondary | ICD-10-CM | POA: Diagnosis not present

## 2018-02-01 DIAGNOSIS — I10 Essential (primary) hypertension: Secondary | ICD-10-CM | POA: Diagnosis not present

## 2018-02-01 DIAGNOSIS — E46 Unspecified protein-calorie malnutrition: Secondary | ICD-10-CM | POA: Diagnosis not present

## 2018-02-03 DIAGNOSIS — E46 Unspecified protein-calorie malnutrition: Secondary | ICD-10-CM | POA: Diagnosis not present

## 2018-02-03 DIAGNOSIS — E039 Hypothyroidism, unspecified: Secondary | ICD-10-CM | POA: Diagnosis not present

## 2018-02-03 DIAGNOSIS — I10 Essential (primary) hypertension: Secondary | ICD-10-CM | POA: Diagnosis not present

## 2018-02-07 DIAGNOSIS — I10 Essential (primary) hypertension: Secondary | ICD-10-CM | POA: Diagnosis not present

## 2018-02-07 DIAGNOSIS — E46 Unspecified protein-calorie malnutrition: Secondary | ICD-10-CM | POA: Diagnosis not present

## 2018-02-07 DIAGNOSIS — E039 Hypothyroidism, unspecified: Secondary | ICD-10-CM | POA: Diagnosis not present

## 2018-02-08 DIAGNOSIS — I10 Essential (primary) hypertension: Secondary | ICD-10-CM | POA: Diagnosis not present

## 2018-02-08 DIAGNOSIS — E039 Hypothyroidism, unspecified: Secondary | ICD-10-CM | POA: Diagnosis not present

## 2018-02-08 DIAGNOSIS — E46 Unspecified protein-calorie malnutrition: Secondary | ICD-10-CM | POA: Diagnosis not present

## 2018-02-10 DIAGNOSIS — E46 Unspecified protein-calorie malnutrition: Secondary | ICD-10-CM | POA: Diagnosis not present

## 2018-02-10 DIAGNOSIS — E039 Hypothyroidism, unspecified: Secondary | ICD-10-CM | POA: Diagnosis not present

## 2018-02-10 DIAGNOSIS — I10 Essential (primary) hypertension: Secondary | ICD-10-CM | POA: Diagnosis not present

## 2018-02-14 DIAGNOSIS — E46 Unspecified protein-calorie malnutrition: Secondary | ICD-10-CM | POA: Diagnosis not present

## 2018-02-14 DIAGNOSIS — I10 Essential (primary) hypertension: Secondary | ICD-10-CM | POA: Diagnosis not present

## 2018-02-14 DIAGNOSIS — E039 Hypothyroidism, unspecified: Secondary | ICD-10-CM | POA: Diagnosis not present

## 2018-02-15 DIAGNOSIS — E039 Hypothyroidism, unspecified: Secondary | ICD-10-CM | POA: Diagnosis not present

## 2018-02-15 DIAGNOSIS — E46 Unspecified protein-calorie malnutrition: Secondary | ICD-10-CM | POA: Diagnosis not present

## 2018-02-15 DIAGNOSIS — I10 Essential (primary) hypertension: Secondary | ICD-10-CM | POA: Diagnosis not present

## 2018-02-17 DIAGNOSIS — E039 Hypothyroidism, unspecified: Secondary | ICD-10-CM | POA: Diagnosis not present

## 2018-02-17 DIAGNOSIS — E46 Unspecified protein-calorie malnutrition: Secondary | ICD-10-CM | POA: Diagnosis not present

## 2018-02-17 DIAGNOSIS — I10 Essential (primary) hypertension: Secondary | ICD-10-CM | POA: Diagnosis not present

## 2018-02-21 DIAGNOSIS — E039 Hypothyroidism, unspecified: Secondary | ICD-10-CM | POA: Diagnosis not present

## 2018-02-21 DIAGNOSIS — E46 Unspecified protein-calorie malnutrition: Secondary | ICD-10-CM | POA: Diagnosis not present

## 2018-02-21 DIAGNOSIS — I10 Essential (primary) hypertension: Secondary | ICD-10-CM | POA: Diagnosis not present

## 2018-02-22 DIAGNOSIS — E46 Unspecified protein-calorie malnutrition: Secondary | ICD-10-CM | POA: Diagnosis not present

## 2018-02-22 DIAGNOSIS — I10 Essential (primary) hypertension: Secondary | ICD-10-CM | POA: Diagnosis not present

## 2018-02-22 DIAGNOSIS — E039 Hypothyroidism, unspecified: Secondary | ICD-10-CM | POA: Diagnosis not present

## 2018-02-24 DIAGNOSIS — E46 Unspecified protein-calorie malnutrition: Secondary | ICD-10-CM | POA: Diagnosis not present

## 2018-02-24 DIAGNOSIS — I10 Essential (primary) hypertension: Secondary | ICD-10-CM | POA: Diagnosis not present

## 2018-02-24 DIAGNOSIS — E039 Hypothyroidism, unspecified: Secondary | ICD-10-CM | POA: Diagnosis not present

## 2018-02-25 DIAGNOSIS — E46 Unspecified protein-calorie malnutrition: Secondary | ICD-10-CM | POA: Diagnosis not present

## 2018-02-25 DIAGNOSIS — I10 Essential (primary) hypertension: Secondary | ICD-10-CM | POA: Diagnosis not present

## 2018-02-25 DIAGNOSIS — E039 Hypothyroidism, unspecified: Secondary | ICD-10-CM | POA: Diagnosis not present

## 2018-02-28 DIAGNOSIS — E039 Hypothyroidism, unspecified: Secondary | ICD-10-CM | POA: Diagnosis not present

## 2018-02-28 DIAGNOSIS — I10 Essential (primary) hypertension: Secondary | ICD-10-CM | POA: Diagnosis not present

## 2018-02-28 DIAGNOSIS — E46 Unspecified protein-calorie malnutrition: Secondary | ICD-10-CM | POA: Diagnosis not present

## 2018-03-01 DIAGNOSIS — I10 Essential (primary) hypertension: Secondary | ICD-10-CM | POA: Diagnosis not present

## 2018-03-01 DIAGNOSIS — E46 Unspecified protein-calorie malnutrition: Secondary | ICD-10-CM | POA: Diagnosis not present

## 2018-03-01 DIAGNOSIS — E039 Hypothyroidism, unspecified: Secondary | ICD-10-CM | POA: Diagnosis not present

## 2018-03-03 DIAGNOSIS — E039 Hypothyroidism, unspecified: Secondary | ICD-10-CM | POA: Diagnosis not present

## 2018-03-03 DIAGNOSIS — E46 Unspecified protein-calorie malnutrition: Secondary | ICD-10-CM | POA: Diagnosis not present

## 2018-03-03 DIAGNOSIS — I10 Essential (primary) hypertension: Secondary | ICD-10-CM | POA: Diagnosis not present

## 2018-03-07 DIAGNOSIS — I10 Essential (primary) hypertension: Secondary | ICD-10-CM | POA: Diagnosis not present

## 2018-03-07 DIAGNOSIS — E039 Hypothyroidism, unspecified: Secondary | ICD-10-CM | POA: Diagnosis not present

## 2018-03-07 DIAGNOSIS — E46 Unspecified protein-calorie malnutrition: Secondary | ICD-10-CM | POA: Diagnosis not present

## 2018-03-08 DIAGNOSIS — E039 Hypothyroidism, unspecified: Secondary | ICD-10-CM | POA: Diagnosis not present

## 2018-03-08 DIAGNOSIS — I10 Essential (primary) hypertension: Secondary | ICD-10-CM | POA: Diagnosis not present

## 2018-03-08 DIAGNOSIS — E46 Unspecified protein-calorie malnutrition: Secondary | ICD-10-CM | POA: Diagnosis not present

## 2018-03-10 DIAGNOSIS — E46 Unspecified protein-calorie malnutrition: Secondary | ICD-10-CM | POA: Diagnosis not present

## 2018-03-10 DIAGNOSIS — E039 Hypothyroidism, unspecified: Secondary | ICD-10-CM | POA: Diagnosis not present

## 2018-03-10 DIAGNOSIS — I10 Essential (primary) hypertension: Secondary | ICD-10-CM | POA: Diagnosis not present

## 2018-03-14 DIAGNOSIS — E46 Unspecified protein-calorie malnutrition: Secondary | ICD-10-CM | POA: Diagnosis not present

## 2018-03-14 DIAGNOSIS — E039 Hypothyroidism, unspecified: Secondary | ICD-10-CM | POA: Diagnosis not present

## 2018-03-14 DIAGNOSIS — I10 Essential (primary) hypertension: Secondary | ICD-10-CM | POA: Diagnosis not present

## 2018-03-15 DIAGNOSIS — I10 Essential (primary) hypertension: Secondary | ICD-10-CM | POA: Diagnosis not present

## 2018-03-15 DIAGNOSIS — E46 Unspecified protein-calorie malnutrition: Secondary | ICD-10-CM | POA: Diagnosis not present

## 2018-03-15 DIAGNOSIS — E039 Hypothyroidism, unspecified: Secondary | ICD-10-CM | POA: Diagnosis not present

## 2018-03-16 DIAGNOSIS — E039 Hypothyroidism, unspecified: Secondary | ICD-10-CM | POA: Diagnosis not present

## 2018-03-16 DIAGNOSIS — I10 Essential (primary) hypertension: Secondary | ICD-10-CM | POA: Diagnosis not present

## 2018-03-16 DIAGNOSIS — E46 Unspecified protein-calorie malnutrition: Secondary | ICD-10-CM | POA: Diagnosis not present

## 2018-03-21 DIAGNOSIS — I10 Essential (primary) hypertension: Secondary | ICD-10-CM | POA: Diagnosis not present

## 2018-03-21 DIAGNOSIS — E46 Unspecified protein-calorie malnutrition: Secondary | ICD-10-CM | POA: Diagnosis not present

## 2018-03-21 DIAGNOSIS — E039 Hypothyroidism, unspecified: Secondary | ICD-10-CM | POA: Diagnosis not present

## 2018-03-22 DIAGNOSIS — E46 Unspecified protein-calorie malnutrition: Secondary | ICD-10-CM | POA: Diagnosis not present

## 2018-03-22 DIAGNOSIS — E039 Hypothyroidism, unspecified: Secondary | ICD-10-CM | POA: Diagnosis not present

## 2018-03-22 DIAGNOSIS — I10 Essential (primary) hypertension: Secondary | ICD-10-CM | POA: Diagnosis not present

## 2018-03-24 DIAGNOSIS — E039 Hypothyroidism, unspecified: Secondary | ICD-10-CM | POA: Diagnosis not present

## 2018-03-24 DIAGNOSIS — E46 Unspecified protein-calorie malnutrition: Secondary | ICD-10-CM | POA: Diagnosis not present

## 2018-03-24 DIAGNOSIS — I10 Essential (primary) hypertension: Secondary | ICD-10-CM | POA: Diagnosis not present

## 2018-03-26 DIAGNOSIS — I10 Essential (primary) hypertension: Secondary | ICD-10-CM | POA: Diagnosis not present

## 2018-03-26 DIAGNOSIS — E46 Unspecified protein-calorie malnutrition: Secondary | ICD-10-CM | POA: Diagnosis not present

## 2018-03-26 DIAGNOSIS — E039 Hypothyroidism, unspecified: Secondary | ICD-10-CM | POA: Diagnosis not present

## 2018-03-27 DIAGNOSIS — E46 Unspecified protein-calorie malnutrition: Secondary | ICD-10-CM | POA: Diagnosis not present

## 2018-03-27 DIAGNOSIS — I10 Essential (primary) hypertension: Secondary | ICD-10-CM | POA: Diagnosis not present

## 2018-03-27 DIAGNOSIS — E039 Hypothyroidism, unspecified: Secondary | ICD-10-CM | POA: Diagnosis not present

## 2018-03-28 DIAGNOSIS — I10 Essential (primary) hypertension: Secondary | ICD-10-CM | POA: Diagnosis not present

## 2018-03-28 DIAGNOSIS — E46 Unspecified protein-calorie malnutrition: Secondary | ICD-10-CM | POA: Diagnosis not present

## 2018-03-28 DIAGNOSIS — E039 Hypothyroidism, unspecified: Secondary | ICD-10-CM | POA: Diagnosis not present

## 2018-03-29 DIAGNOSIS — E039 Hypothyroidism, unspecified: Secondary | ICD-10-CM | POA: Diagnosis not present

## 2018-03-29 DIAGNOSIS — I10 Essential (primary) hypertension: Secondary | ICD-10-CM | POA: Diagnosis not present

## 2018-03-29 DIAGNOSIS — E46 Unspecified protein-calorie malnutrition: Secondary | ICD-10-CM | POA: Diagnosis not present

## 2018-03-30 DIAGNOSIS — K5900 Constipation, unspecified: Secondary | ICD-10-CM | POA: Diagnosis not present

## 2018-03-30 DIAGNOSIS — M6281 Muscle weakness (generalized): Secondary | ICD-10-CM | POA: Diagnosis not present

## 2018-03-30 DIAGNOSIS — G894 Chronic pain syndrome: Secondary | ICD-10-CM | POA: Diagnosis not present

## 2018-03-30 DIAGNOSIS — Z0000 Encounter for general adult medical examination without abnormal findings: Secondary | ICD-10-CM | POA: Diagnosis not present

## 2018-03-30 DIAGNOSIS — I10 Essential (primary) hypertension: Secondary | ICD-10-CM | POA: Diagnosis not present

## 2018-03-30 DIAGNOSIS — E440 Moderate protein-calorie malnutrition: Secondary | ICD-10-CM | POA: Diagnosis not present

## 2018-03-31 DIAGNOSIS — E46 Unspecified protein-calorie malnutrition: Secondary | ICD-10-CM | POA: Diagnosis not present

## 2018-03-31 DIAGNOSIS — I10 Essential (primary) hypertension: Secondary | ICD-10-CM | POA: Diagnosis not present

## 2018-03-31 DIAGNOSIS — E039 Hypothyroidism, unspecified: Secondary | ICD-10-CM | POA: Diagnosis not present

## 2018-04-04 DIAGNOSIS — E039 Hypothyroidism, unspecified: Secondary | ICD-10-CM | POA: Diagnosis not present

## 2018-04-04 DIAGNOSIS — I10 Essential (primary) hypertension: Secondary | ICD-10-CM | POA: Diagnosis not present

## 2018-04-04 DIAGNOSIS — E46 Unspecified protein-calorie malnutrition: Secondary | ICD-10-CM | POA: Diagnosis not present

## 2018-04-05 DIAGNOSIS — E46 Unspecified protein-calorie malnutrition: Secondary | ICD-10-CM | POA: Diagnosis not present

## 2018-04-05 DIAGNOSIS — I10 Essential (primary) hypertension: Secondary | ICD-10-CM | POA: Diagnosis not present

## 2018-04-05 DIAGNOSIS — E039 Hypothyroidism, unspecified: Secondary | ICD-10-CM | POA: Diagnosis not present

## 2018-04-07 DIAGNOSIS — E46 Unspecified protein-calorie malnutrition: Secondary | ICD-10-CM | POA: Diagnosis not present

## 2018-04-07 DIAGNOSIS — I10 Essential (primary) hypertension: Secondary | ICD-10-CM | POA: Diagnosis not present

## 2018-04-07 DIAGNOSIS — E039 Hypothyroidism, unspecified: Secondary | ICD-10-CM | POA: Diagnosis not present

## 2018-04-10 DIAGNOSIS — E46 Unspecified protein-calorie malnutrition: Secondary | ICD-10-CM | POA: Diagnosis not present

## 2018-04-10 DIAGNOSIS — I10 Essential (primary) hypertension: Secondary | ICD-10-CM | POA: Diagnosis not present

## 2018-04-10 DIAGNOSIS — E039 Hypothyroidism, unspecified: Secondary | ICD-10-CM | POA: Diagnosis not present

## 2018-04-11 DIAGNOSIS — E46 Unspecified protein-calorie malnutrition: Secondary | ICD-10-CM | POA: Diagnosis not present

## 2018-04-11 DIAGNOSIS — I10 Essential (primary) hypertension: Secondary | ICD-10-CM | POA: Diagnosis not present

## 2018-04-11 DIAGNOSIS — E039 Hypothyroidism, unspecified: Secondary | ICD-10-CM | POA: Diagnosis not present

## 2018-04-14 DIAGNOSIS — E46 Unspecified protein-calorie malnutrition: Secondary | ICD-10-CM | POA: Diagnosis not present

## 2018-04-14 DIAGNOSIS — I10 Essential (primary) hypertension: Secondary | ICD-10-CM | POA: Diagnosis not present

## 2018-04-14 DIAGNOSIS — E039 Hypothyroidism, unspecified: Secondary | ICD-10-CM | POA: Diagnosis not present

## 2018-04-21 DIAGNOSIS — I10 Essential (primary) hypertension: Secondary | ICD-10-CM | POA: Diagnosis not present

## 2018-04-21 DIAGNOSIS — E039 Hypothyroidism, unspecified: Secondary | ICD-10-CM | POA: Diagnosis not present

## 2018-04-21 DIAGNOSIS — E46 Unspecified protein-calorie malnutrition: Secondary | ICD-10-CM | POA: Diagnosis not present

## 2018-04-26 DIAGNOSIS — E039 Hypothyroidism, unspecified: Secondary | ICD-10-CM | POA: Diagnosis not present

## 2018-04-26 DIAGNOSIS — I10 Essential (primary) hypertension: Secondary | ICD-10-CM | POA: Diagnosis not present

## 2018-04-26 DIAGNOSIS — E43 Unspecified severe protein-calorie malnutrition: Secondary | ICD-10-CM | POA: Diagnosis not present

## 2018-05-03 DIAGNOSIS — I10 Essential (primary) hypertension: Secondary | ICD-10-CM | POA: Diagnosis not present

## 2018-05-03 DIAGNOSIS — E039 Hypothyroidism, unspecified: Secondary | ICD-10-CM | POA: Diagnosis not present

## 2018-05-03 DIAGNOSIS — E43 Unspecified severe protein-calorie malnutrition: Secondary | ICD-10-CM | POA: Diagnosis not present

## 2018-05-26 DIAGNOSIS — I10 Essential (primary) hypertension: Secondary | ICD-10-CM | POA: Diagnosis not present

## 2018-05-26 DIAGNOSIS — E039 Hypothyroidism, unspecified: Secondary | ICD-10-CM | POA: Diagnosis not present

## 2018-05-26 DIAGNOSIS — E43 Unspecified severe protein-calorie malnutrition: Secondary | ICD-10-CM | POA: Diagnosis not present

## 2018-05-31 DIAGNOSIS — E782 Mixed hyperlipidemia: Secondary | ICD-10-CM | POA: Diagnosis not present

## 2018-05-31 DIAGNOSIS — I10 Essential (primary) hypertension: Secondary | ICD-10-CM | POA: Diagnosis not present

## 2018-05-31 DIAGNOSIS — R630 Anorexia: Secondary | ICD-10-CM | POA: Diagnosis not present

## 2018-06-04 DIAGNOSIS — E43 Unspecified severe protein-calorie malnutrition: Secondary | ICD-10-CM | POA: Diagnosis not present

## 2018-06-04 DIAGNOSIS — E039 Hypothyroidism, unspecified: Secondary | ICD-10-CM | POA: Diagnosis not present

## 2018-06-04 DIAGNOSIS — I10 Essential (primary) hypertension: Secondary | ICD-10-CM | POA: Diagnosis not present

## 2018-06-21 DIAGNOSIS — E43 Unspecified severe protein-calorie malnutrition: Secondary | ICD-10-CM | POA: Diagnosis not present

## 2018-06-21 DIAGNOSIS — I10 Essential (primary) hypertension: Secondary | ICD-10-CM | POA: Diagnosis not present

## 2018-06-21 DIAGNOSIS — E039 Hypothyroidism, unspecified: Secondary | ICD-10-CM | POA: Diagnosis not present

## 2018-06-26 DIAGNOSIS — I10 Essential (primary) hypertension: Secondary | ICD-10-CM | POA: Diagnosis not present

## 2018-06-26 DIAGNOSIS — Z681 Body mass index (BMI) 19 or less, adult: Secondary | ICD-10-CM | POA: Diagnosis not present

## 2018-06-26 DIAGNOSIS — R443 Hallucinations, unspecified: Secondary | ICD-10-CM | POA: Diagnosis not present

## 2018-06-26 DIAGNOSIS — Z9181 History of falling: Secondary | ICD-10-CM | POA: Diagnosis not present

## 2018-06-26 DIAGNOSIS — E43 Unspecified severe protein-calorie malnutrition: Secondary | ICD-10-CM | POA: Diagnosis not present

## 2018-06-26 DIAGNOSIS — E039 Hypothyroidism, unspecified: Secondary | ICD-10-CM | POA: Diagnosis not present

## 2018-07-05 DIAGNOSIS — Z681 Body mass index (BMI) 19 or less, adult: Secondary | ICD-10-CM | POA: Diagnosis not present

## 2018-07-05 DIAGNOSIS — R443 Hallucinations, unspecified: Secondary | ICD-10-CM | POA: Diagnosis not present

## 2018-07-05 DIAGNOSIS — Z9181 History of falling: Secondary | ICD-10-CM | POA: Diagnosis not present

## 2018-07-05 DIAGNOSIS — I10 Essential (primary) hypertension: Secondary | ICD-10-CM | POA: Diagnosis not present

## 2018-07-05 DIAGNOSIS — E43 Unspecified severe protein-calorie malnutrition: Secondary | ICD-10-CM | POA: Diagnosis not present

## 2018-07-05 DIAGNOSIS — E039 Hypothyroidism, unspecified: Secondary | ICD-10-CM | POA: Diagnosis not present

## 2018-07-13 DIAGNOSIS — E43 Unspecified severe protein-calorie malnutrition: Secondary | ICD-10-CM | POA: Diagnosis not present

## 2018-07-13 DIAGNOSIS — E039 Hypothyroidism, unspecified: Secondary | ICD-10-CM | POA: Diagnosis not present

## 2018-07-13 DIAGNOSIS — Z681 Body mass index (BMI) 19 or less, adult: Secondary | ICD-10-CM | POA: Diagnosis not present

## 2018-07-13 DIAGNOSIS — I10 Essential (primary) hypertension: Secondary | ICD-10-CM | POA: Diagnosis not present

## 2018-07-13 DIAGNOSIS — Z9181 History of falling: Secondary | ICD-10-CM | POA: Diagnosis not present

## 2018-07-13 DIAGNOSIS — R443 Hallucinations, unspecified: Secondary | ICD-10-CM | POA: Diagnosis not present

## 2018-07-19 DIAGNOSIS — E039 Hypothyroidism, unspecified: Secondary | ICD-10-CM | POA: Diagnosis not present

## 2018-07-19 DIAGNOSIS — R443 Hallucinations, unspecified: Secondary | ICD-10-CM | POA: Diagnosis not present

## 2018-07-19 DIAGNOSIS — E43 Unspecified severe protein-calorie malnutrition: Secondary | ICD-10-CM | POA: Diagnosis not present

## 2018-07-19 DIAGNOSIS — Z681 Body mass index (BMI) 19 or less, adult: Secondary | ICD-10-CM | POA: Diagnosis not present

## 2018-07-19 DIAGNOSIS — I10 Essential (primary) hypertension: Secondary | ICD-10-CM | POA: Diagnosis not present

## 2018-07-19 DIAGNOSIS — Z9181 History of falling: Secondary | ICD-10-CM | POA: Diagnosis not present

## 2018-07-26 DIAGNOSIS — I10 Essential (primary) hypertension: Secondary | ICD-10-CM | POA: Diagnosis not present

## 2018-07-26 DIAGNOSIS — E039 Hypothyroidism, unspecified: Secondary | ICD-10-CM | POA: Diagnosis not present

## 2018-07-26 DIAGNOSIS — E43 Unspecified severe protein-calorie malnutrition: Secondary | ICD-10-CM | POA: Diagnosis not present

## 2018-07-26 DIAGNOSIS — R443 Hallucinations, unspecified: Secondary | ICD-10-CM | POA: Diagnosis not present

## 2018-07-26 DIAGNOSIS — Z681 Body mass index (BMI) 19 or less, adult: Secondary | ICD-10-CM | POA: Diagnosis not present

## 2018-07-26 DIAGNOSIS — Z9181 History of falling: Secondary | ICD-10-CM | POA: Diagnosis not present

## 2018-08-02 DIAGNOSIS — I10 Essential (primary) hypertension: Secondary | ICD-10-CM | POA: Diagnosis not present

## 2018-08-02 DIAGNOSIS — E43 Unspecified severe protein-calorie malnutrition: Secondary | ICD-10-CM | POA: Diagnosis not present

## 2018-08-02 DIAGNOSIS — E039 Hypothyroidism, unspecified: Secondary | ICD-10-CM | POA: Diagnosis not present

## 2018-08-02 DIAGNOSIS — R443 Hallucinations, unspecified: Secondary | ICD-10-CM | POA: Diagnosis not present

## 2018-08-02 DIAGNOSIS — Z9181 History of falling: Secondary | ICD-10-CM | POA: Diagnosis not present

## 2018-08-02 DIAGNOSIS — Z681 Body mass index (BMI) 19 or less, adult: Secondary | ICD-10-CM | POA: Diagnosis not present

## 2018-08-09 DIAGNOSIS — Z9181 History of falling: Secondary | ICD-10-CM | POA: Diagnosis not present

## 2018-08-09 DIAGNOSIS — E039 Hypothyroidism, unspecified: Secondary | ICD-10-CM | POA: Diagnosis not present

## 2018-08-09 DIAGNOSIS — E43 Unspecified severe protein-calorie malnutrition: Secondary | ICD-10-CM | POA: Diagnosis not present

## 2018-08-09 DIAGNOSIS — R443 Hallucinations, unspecified: Secondary | ICD-10-CM | POA: Diagnosis not present

## 2018-08-09 DIAGNOSIS — I10 Essential (primary) hypertension: Secondary | ICD-10-CM | POA: Diagnosis not present

## 2018-08-09 DIAGNOSIS — Z681 Body mass index (BMI) 19 or less, adult: Secondary | ICD-10-CM | POA: Diagnosis not present

## 2018-08-23 DIAGNOSIS — Z9181 History of falling: Secondary | ICD-10-CM | POA: Diagnosis not present

## 2018-08-23 DIAGNOSIS — R443 Hallucinations, unspecified: Secondary | ICD-10-CM | POA: Diagnosis not present

## 2018-08-23 DIAGNOSIS — E039 Hypothyroidism, unspecified: Secondary | ICD-10-CM | POA: Diagnosis not present

## 2018-08-23 DIAGNOSIS — E43 Unspecified severe protein-calorie malnutrition: Secondary | ICD-10-CM | POA: Diagnosis not present

## 2018-08-23 DIAGNOSIS — I10 Essential (primary) hypertension: Secondary | ICD-10-CM | POA: Diagnosis not present

## 2018-08-23 DIAGNOSIS — Z681 Body mass index (BMI) 19 or less, adult: Secondary | ICD-10-CM | POA: Diagnosis not present

## 2018-08-25 ENCOUNTER — Other Ambulatory Visit: Payer: Self-pay

## 2018-08-26 DIAGNOSIS — E43 Unspecified severe protein-calorie malnutrition: Secondary | ICD-10-CM | POA: Diagnosis not present

## 2018-08-26 DIAGNOSIS — Z681 Body mass index (BMI) 19 or less, adult: Secondary | ICD-10-CM | POA: Diagnosis not present

## 2018-08-26 DIAGNOSIS — R1310 Dysphagia, unspecified: Secondary | ICD-10-CM | POA: Diagnosis not present

## 2018-08-26 DIAGNOSIS — Z741 Need for assistance with personal care: Secondary | ICD-10-CM | POA: Diagnosis not present

## 2018-08-26 DIAGNOSIS — E039 Hypothyroidism, unspecified: Secondary | ICD-10-CM | POA: Diagnosis not present

## 2018-08-26 DIAGNOSIS — R443 Hallucinations, unspecified: Secondary | ICD-10-CM | POA: Diagnosis not present

## 2018-08-26 DIAGNOSIS — Z9181 History of falling: Secondary | ICD-10-CM | POA: Diagnosis not present

## 2018-08-26 DIAGNOSIS — I10 Essential (primary) hypertension: Secondary | ICD-10-CM | POA: Diagnosis not present

## 2018-08-30 DIAGNOSIS — R1310 Dysphagia, unspecified: Secondary | ICD-10-CM | POA: Diagnosis not present

## 2018-08-30 DIAGNOSIS — Z9181 History of falling: Secondary | ICD-10-CM | POA: Diagnosis not present

## 2018-08-30 DIAGNOSIS — E43 Unspecified severe protein-calorie malnutrition: Secondary | ICD-10-CM | POA: Diagnosis not present

## 2018-08-30 DIAGNOSIS — R443 Hallucinations, unspecified: Secondary | ICD-10-CM | POA: Diagnosis not present

## 2018-08-30 DIAGNOSIS — I10 Essential (primary) hypertension: Secondary | ICD-10-CM | POA: Diagnosis not present

## 2018-08-30 DIAGNOSIS — Z681 Body mass index (BMI) 19 or less, adult: Secondary | ICD-10-CM | POA: Diagnosis not present

## 2018-09-06 DIAGNOSIS — E43 Unspecified severe protein-calorie malnutrition: Secondary | ICD-10-CM | POA: Diagnosis not present

## 2018-09-06 DIAGNOSIS — I10 Essential (primary) hypertension: Secondary | ICD-10-CM | POA: Diagnosis not present

## 2018-09-06 DIAGNOSIS — R443 Hallucinations, unspecified: Secondary | ICD-10-CM | POA: Diagnosis not present

## 2018-09-06 DIAGNOSIS — G894 Chronic pain syndrome: Secondary | ICD-10-CM | POA: Diagnosis not present

## 2018-09-06 DIAGNOSIS — R1310 Dysphagia, unspecified: Secondary | ICD-10-CM | POA: Diagnosis not present

## 2018-09-06 DIAGNOSIS — Z9181 History of falling: Secondary | ICD-10-CM | POA: Diagnosis not present

## 2018-09-06 DIAGNOSIS — Z681 Body mass index (BMI) 19 or less, adult: Secondary | ICD-10-CM | POA: Diagnosis not present

## 2018-09-06 DIAGNOSIS — E785 Hyperlipidemia, unspecified: Secondary | ICD-10-CM | POA: Diagnosis not present

## 2018-09-06 DIAGNOSIS — N390 Urinary tract infection, site not specified: Secondary | ICD-10-CM | POA: Diagnosis not present

## 2018-09-20 DIAGNOSIS — R1310 Dysphagia, unspecified: Secondary | ICD-10-CM | POA: Diagnosis not present

## 2018-09-20 DIAGNOSIS — I10 Essential (primary) hypertension: Secondary | ICD-10-CM | POA: Diagnosis not present

## 2018-09-20 DIAGNOSIS — E43 Unspecified severe protein-calorie malnutrition: Secondary | ICD-10-CM | POA: Diagnosis not present

## 2018-09-20 DIAGNOSIS — Z681 Body mass index (BMI) 19 or less, adult: Secondary | ICD-10-CM | POA: Diagnosis not present

## 2018-09-20 DIAGNOSIS — R443 Hallucinations, unspecified: Secondary | ICD-10-CM | POA: Diagnosis not present

## 2018-09-20 DIAGNOSIS — Z9181 History of falling: Secondary | ICD-10-CM | POA: Diagnosis not present

## 2018-09-26 DIAGNOSIS — Z681 Body mass index (BMI) 19 or less, adult: Secondary | ICD-10-CM | POA: Diagnosis not present

## 2018-09-26 DIAGNOSIS — I10 Essential (primary) hypertension: Secondary | ICD-10-CM | POA: Diagnosis not present

## 2018-09-26 DIAGNOSIS — R1310 Dysphagia, unspecified: Secondary | ICD-10-CM | POA: Diagnosis not present

## 2018-09-26 DIAGNOSIS — R443 Hallucinations, unspecified: Secondary | ICD-10-CM | POA: Diagnosis not present

## 2018-09-26 DIAGNOSIS — E039 Hypothyroidism, unspecified: Secondary | ICD-10-CM | POA: Diagnosis not present

## 2018-09-26 DIAGNOSIS — Z9181 History of falling: Secondary | ICD-10-CM | POA: Diagnosis not present

## 2018-09-26 DIAGNOSIS — E43 Unspecified severe protein-calorie malnutrition: Secondary | ICD-10-CM | POA: Diagnosis not present

## 2018-09-26 DIAGNOSIS — Z741 Need for assistance with personal care: Secondary | ICD-10-CM | POA: Diagnosis not present

## 2018-09-27 DIAGNOSIS — Z9181 History of falling: Secondary | ICD-10-CM | POA: Diagnosis not present

## 2018-09-27 DIAGNOSIS — E43 Unspecified severe protein-calorie malnutrition: Secondary | ICD-10-CM | POA: Diagnosis not present

## 2018-09-27 DIAGNOSIS — I10 Essential (primary) hypertension: Secondary | ICD-10-CM | POA: Diagnosis not present

## 2018-09-27 DIAGNOSIS — Z681 Body mass index (BMI) 19 or less, adult: Secondary | ICD-10-CM | POA: Diagnosis not present

## 2018-09-27 DIAGNOSIS — R443 Hallucinations, unspecified: Secondary | ICD-10-CM | POA: Diagnosis not present

## 2018-09-27 DIAGNOSIS — R1310 Dysphagia, unspecified: Secondary | ICD-10-CM | POA: Diagnosis not present

## 2018-10-04 DIAGNOSIS — E43 Unspecified severe protein-calorie malnutrition: Secondary | ICD-10-CM | POA: Diagnosis not present

## 2018-10-04 DIAGNOSIS — R1310 Dysphagia, unspecified: Secondary | ICD-10-CM | POA: Diagnosis not present

## 2018-10-04 DIAGNOSIS — R443 Hallucinations, unspecified: Secondary | ICD-10-CM | POA: Diagnosis not present

## 2018-10-04 DIAGNOSIS — Z9181 History of falling: Secondary | ICD-10-CM | POA: Diagnosis not present

## 2018-10-04 DIAGNOSIS — I10 Essential (primary) hypertension: Secondary | ICD-10-CM | POA: Diagnosis not present

## 2018-10-04 DIAGNOSIS — Z681 Body mass index (BMI) 19 or less, adult: Secondary | ICD-10-CM | POA: Diagnosis not present

## 2018-10-10 DIAGNOSIS — I10 Essential (primary) hypertension: Secondary | ICD-10-CM | POA: Diagnosis not present

## 2018-10-10 DIAGNOSIS — R1310 Dysphagia, unspecified: Secondary | ICD-10-CM | POA: Diagnosis not present

## 2018-10-10 DIAGNOSIS — Z9181 History of falling: Secondary | ICD-10-CM | POA: Diagnosis not present

## 2018-10-10 DIAGNOSIS — R443 Hallucinations, unspecified: Secondary | ICD-10-CM | POA: Diagnosis not present

## 2018-10-10 DIAGNOSIS — E43 Unspecified severe protein-calorie malnutrition: Secondary | ICD-10-CM | POA: Diagnosis not present

## 2018-10-10 DIAGNOSIS — Z681 Body mass index (BMI) 19 or less, adult: Secondary | ICD-10-CM | POA: Diagnosis not present

## 2018-10-11 DIAGNOSIS — I10 Essential (primary) hypertension: Secondary | ICD-10-CM | POA: Diagnosis not present

## 2018-10-11 DIAGNOSIS — Z681 Body mass index (BMI) 19 or less, adult: Secondary | ICD-10-CM | POA: Diagnosis not present

## 2018-10-11 DIAGNOSIS — R1310 Dysphagia, unspecified: Secondary | ICD-10-CM | POA: Diagnosis not present

## 2018-10-11 DIAGNOSIS — R443 Hallucinations, unspecified: Secondary | ICD-10-CM | POA: Diagnosis not present

## 2018-10-11 DIAGNOSIS — E43 Unspecified severe protein-calorie malnutrition: Secondary | ICD-10-CM | POA: Diagnosis not present

## 2018-10-11 DIAGNOSIS — Z9181 History of falling: Secondary | ICD-10-CM | POA: Diagnosis not present

## 2018-10-18 DIAGNOSIS — Z9181 History of falling: Secondary | ICD-10-CM | POA: Diagnosis not present

## 2018-10-18 DIAGNOSIS — Z681 Body mass index (BMI) 19 or less, adult: Secondary | ICD-10-CM | POA: Diagnosis not present

## 2018-10-18 DIAGNOSIS — I10 Essential (primary) hypertension: Secondary | ICD-10-CM | POA: Diagnosis not present

## 2018-10-18 DIAGNOSIS — E43 Unspecified severe protein-calorie malnutrition: Secondary | ICD-10-CM | POA: Diagnosis not present

## 2018-10-18 DIAGNOSIS — R1310 Dysphagia, unspecified: Secondary | ICD-10-CM | POA: Diagnosis not present

## 2018-10-18 DIAGNOSIS — R443 Hallucinations, unspecified: Secondary | ICD-10-CM | POA: Diagnosis not present

## 2018-10-25 DIAGNOSIS — I10 Essential (primary) hypertension: Secondary | ICD-10-CM | POA: Diagnosis not present

## 2018-10-25 DIAGNOSIS — Z9181 History of falling: Secondary | ICD-10-CM | POA: Diagnosis not present

## 2018-10-25 DIAGNOSIS — R443 Hallucinations, unspecified: Secondary | ICD-10-CM | POA: Diagnosis not present

## 2018-10-25 DIAGNOSIS — E43 Unspecified severe protein-calorie malnutrition: Secondary | ICD-10-CM | POA: Diagnosis not present

## 2018-10-25 DIAGNOSIS — Z681 Body mass index (BMI) 19 or less, adult: Secondary | ICD-10-CM | POA: Diagnosis not present

## 2018-10-25 DIAGNOSIS — R1310 Dysphagia, unspecified: Secondary | ICD-10-CM | POA: Diagnosis not present

## 2018-10-26 DIAGNOSIS — L89152 Pressure ulcer of sacral region, stage 2: Secondary | ICD-10-CM | POA: Diagnosis not present

## 2018-10-26 DIAGNOSIS — E43 Unspecified severe protein-calorie malnutrition: Secondary | ICD-10-CM | POA: Diagnosis not present

## 2018-10-26 DIAGNOSIS — Z741 Need for assistance with personal care: Secondary | ICD-10-CM | POA: Diagnosis not present

## 2018-10-26 DIAGNOSIS — I10 Essential (primary) hypertension: Secondary | ICD-10-CM | POA: Diagnosis not present

## 2018-10-26 DIAGNOSIS — Z9181 History of falling: Secondary | ICD-10-CM | POA: Diagnosis not present

## 2018-10-26 DIAGNOSIS — R1310 Dysphagia, unspecified: Secondary | ICD-10-CM | POA: Diagnosis not present

## 2018-10-26 DIAGNOSIS — Z681 Body mass index (BMI) 19 or less, adult: Secondary | ICD-10-CM | POA: Diagnosis not present

## 2018-10-26 DIAGNOSIS — E039 Hypothyroidism, unspecified: Secondary | ICD-10-CM | POA: Diagnosis not present

## 2018-10-26 DIAGNOSIS — R443 Hallucinations, unspecified: Secondary | ICD-10-CM | POA: Diagnosis not present

## 2018-10-31 DIAGNOSIS — Z681 Body mass index (BMI) 19 or less, adult: Secondary | ICD-10-CM | POA: Diagnosis not present

## 2018-10-31 DIAGNOSIS — R443 Hallucinations, unspecified: Secondary | ICD-10-CM | POA: Diagnosis not present

## 2018-10-31 DIAGNOSIS — E43 Unspecified severe protein-calorie malnutrition: Secondary | ICD-10-CM | POA: Diagnosis not present

## 2018-10-31 DIAGNOSIS — L89152 Pressure ulcer of sacral region, stage 2: Secondary | ICD-10-CM | POA: Diagnosis not present

## 2018-10-31 DIAGNOSIS — R1310 Dysphagia, unspecified: Secondary | ICD-10-CM | POA: Diagnosis not present

## 2018-10-31 DIAGNOSIS — I10 Essential (primary) hypertension: Secondary | ICD-10-CM | POA: Diagnosis not present

## 2018-11-01 DIAGNOSIS — R1310 Dysphagia, unspecified: Secondary | ICD-10-CM | POA: Diagnosis not present

## 2018-11-01 DIAGNOSIS — R443 Hallucinations, unspecified: Secondary | ICD-10-CM | POA: Diagnosis not present

## 2018-11-01 DIAGNOSIS — L89152 Pressure ulcer of sacral region, stage 2: Secondary | ICD-10-CM | POA: Diagnosis not present

## 2018-11-01 DIAGNOSIS — E43 Unspecified severe protein-calorie malnutrition: Secondary | ICD-10-CM | POA: Diagnosis not present

## 2018-11-01 DIAGNOSIS — Z681 Body mass index (BMI) 19 or less, adult: Secondary | ICD-10-CM | POA: Diagnosis not present

## 2018-11-01 DIAGNOSIS — I10 Essential (primary) hypertension: Secondary | ICD-10-CM | POA: Diagnosis not present

## 2018-11-02 ENCOUNTER — Encounter: Payer: Self-pay | Admitting: Gynecology

## 2018-11-08 DIAGNOSIS — Z681 Body mass index (BMI) 19 or less, adult: Secondary | ICD-10-CM | POA: Diagnosis not present

## 2018-11-08 DIAGNOSIS — I10 Essential (primary) hypertension: Secondary | ICD-10-CM | POA: Diagnosis not present

## 2018-11-08 DIAGNOSIS — R443 Hallucinations, unspecified: Secondary | ICD-10-CM | POA: Diagnosis not present

## 2018-11-08 DIAGNOSIS — R1310 Dysphagia, unspecified: Secondary | ICD-10-CM | POA: Diagnosis not present

## 2018-11-08 DIAGNOSIS — L89152 Pressure ulcer of sacral region, stage 2: Secondary | ICD-10-CM | POA: Diagnosis not present

## 2018-11-08 DIAGNOSIS — E43 Unspecified severe protein-calorie malnutrition: Secondary | ICD-10-CM | POA: Diagnosis not present

## 2018-11-15 DIAGNOSIS — I10 Essential (primary) hypertension: Secondary | ICD-10-CM | POA: Diagnosis not present

## 2018-11-15 DIAGNOSIS — Z681 Body mass index (BMI) 19 or less, adult: Secondary | ICD-10-CM | POA: Diagnosis not present

## 2018-11-15 DIAGNOSIS — L89152 Pressure ulcer of sacral region, stage 2: Secondary | ICD-10-CM | POA: Diagnosis not present

## 2018-11-15 DIAGNOSIS — R1310 Dysphagia, unspecified: Secondary | ICD-10-CM | POA: Diagnosis not present

## 2018-11-15 DIAGNOSIS — E43 Unspecified severe protein-calorie malnutrition: Secondary | ICD-10-CM | POA: Diagnosis not present

## 2018-11-15 DIAGNOSIS — R443 Hallucinations, unspecified: Secondary | ICD-10-CM | POA: Diagnosis not present

## 2018-11-20 IMAGING — US US THYROID
1 series · 13 of 25 positions shown · non-contrast
Comparison: 10/06/2015 and previous back to  12/07/2013

CLINICAL DATA: Nodule

EXAM:
THYROID ULTRASOUND
TECHNIQUE: Ultrasound examination of the thyroid gland and adjacent soft
tissues was performed.

[Series 1: us thyroid · 0.07mm/px · 13 of 44 slices shown]
[im 1/44]
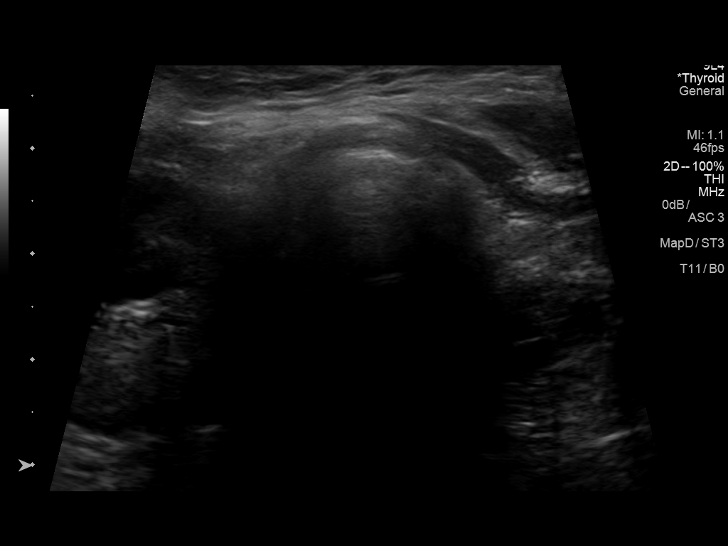
[im 4/44]
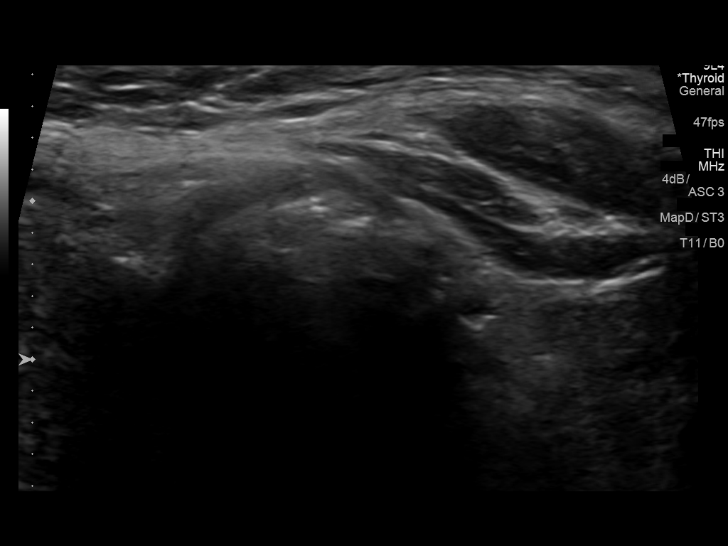
[im 8/44]
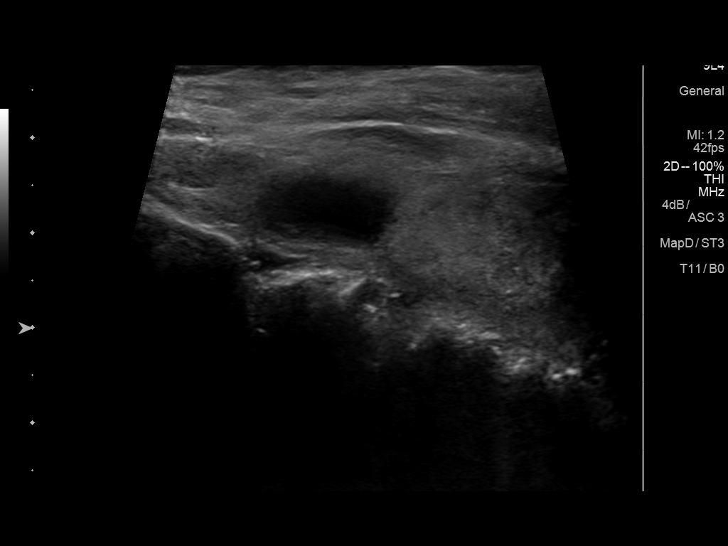
[im 11/44]
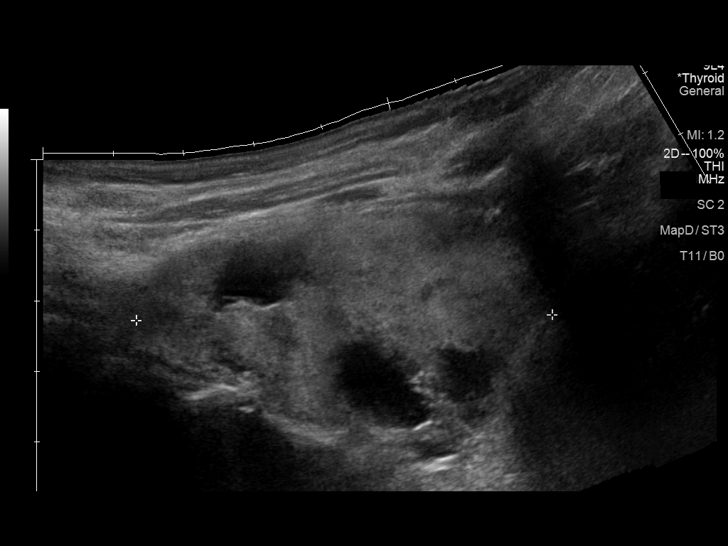
[im 15/44]
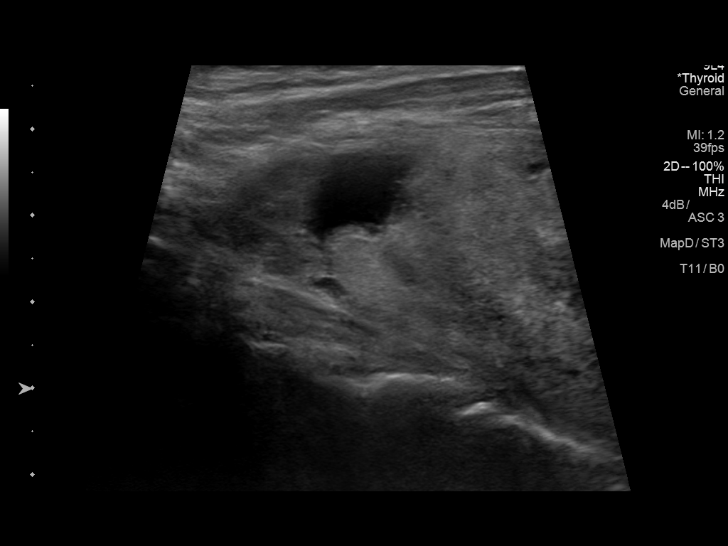
[im 18/44]
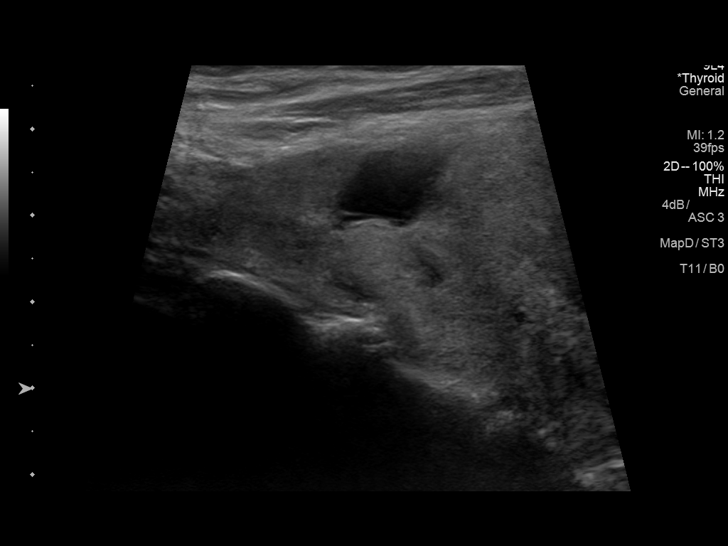
[im 22/44]
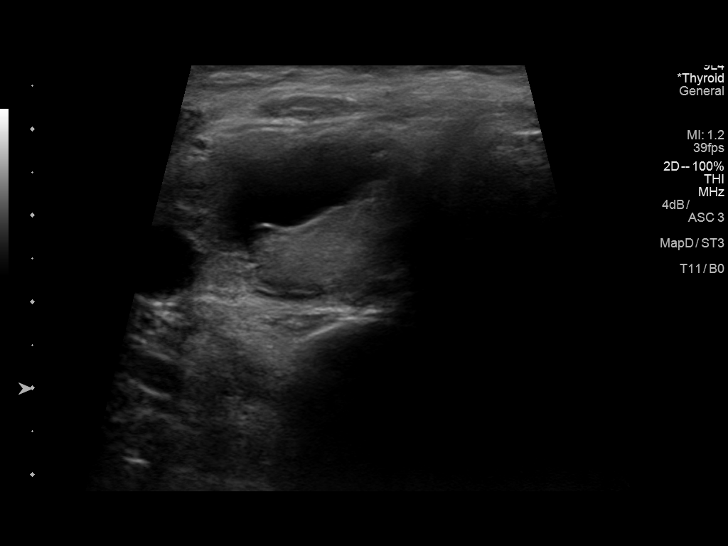
[im 26/44]
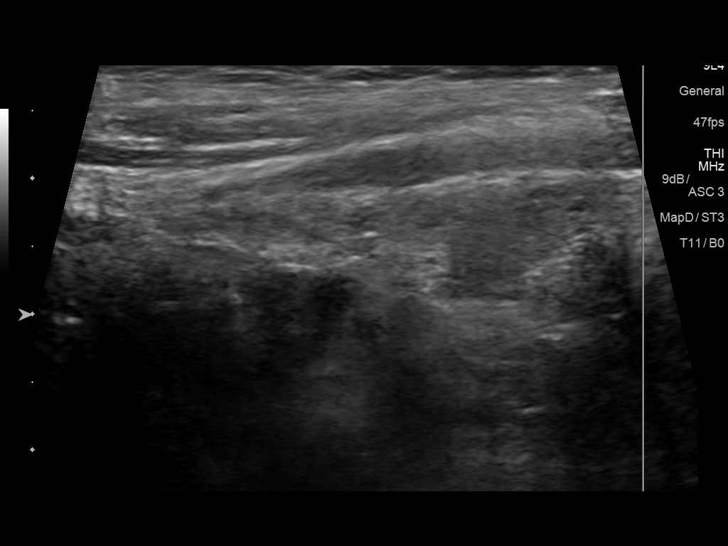
[im 29/44]
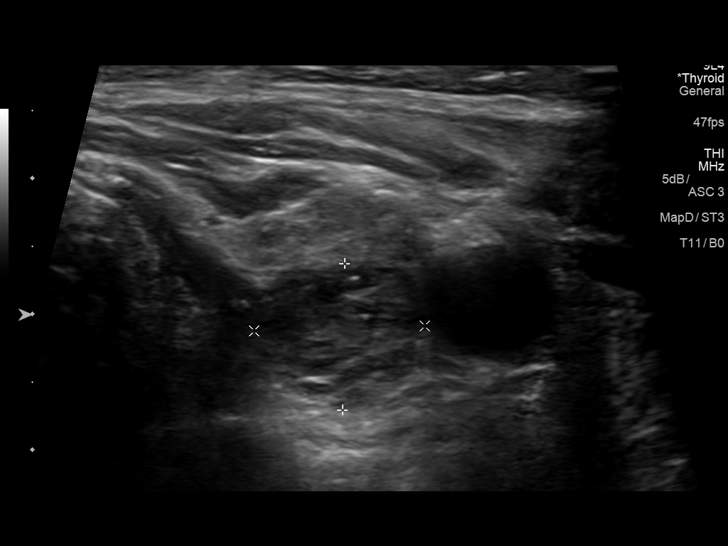
[im 33/44]
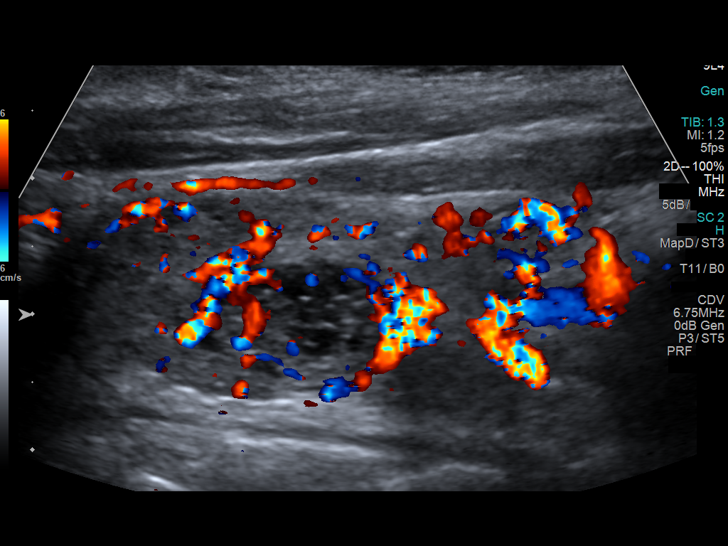
[im 36/44]
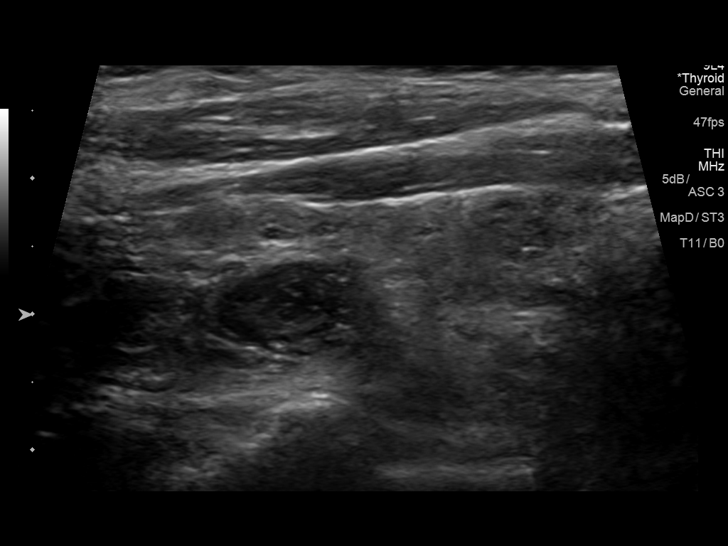
[im 40/44]
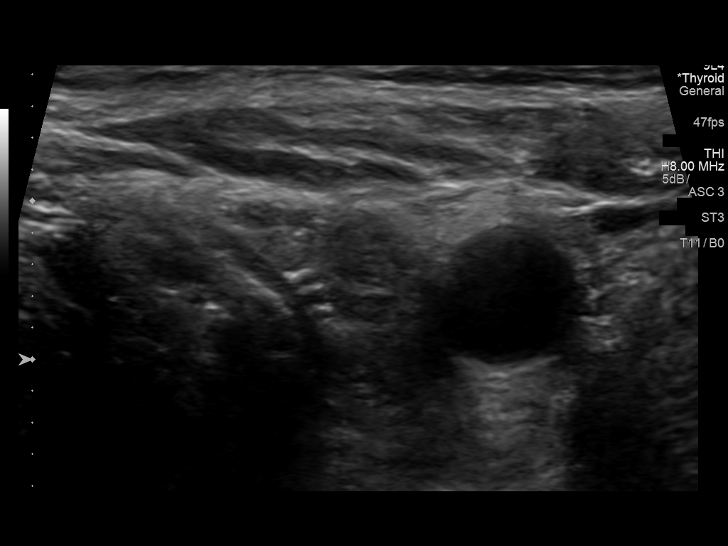
[im 44/44]
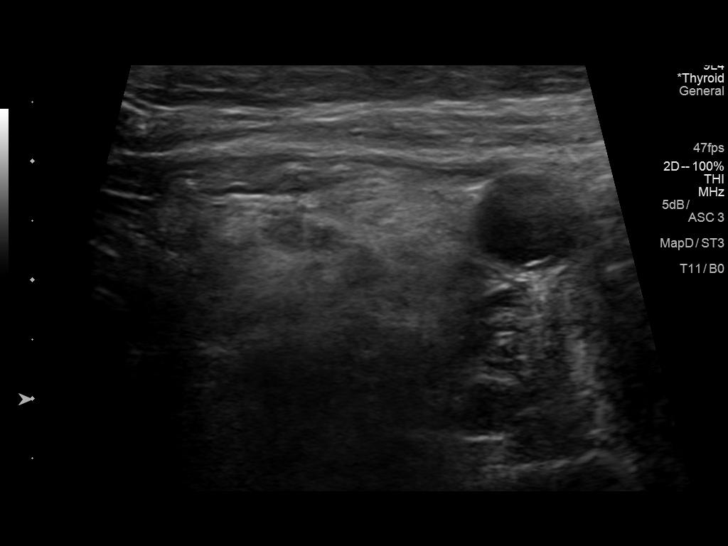

[13 of 25 positions shown; findings below may reference images not displayed]

FINDINGS: Parenchymal Echotexture: Moderately heterogenous

Isthmus: 0.2 cm thickness, stable

Right lobe: 7.2 x 3.5 x 4.3 cm, previously 7 x 3.6 x

Left lobe: 4.4 x 1.5 x 1.5 cm, previously 4.4 x 1.7 x

_________________________________________________________

Estimated total number of nodules >/= 1 cm: 2

Number of spongiform nodules >/=  2 cm not described below (TR1): 0

Number of mixed cystic and solid nodules >/= 1.5 cm not described
below (TR2): 0

_________________________________________________________

Nodule # 1:

Location: Right; Mid

Maximum size: 5.9, previously 5.9 cm; Other 2 dimensions: 3.3 x 4.1,
previously 3 x 4 cm

Composition: solid/almost completely solid (2)

Echogenicity: hyperechoic (1)

Shape: not taller-than-wide (0)

Margins: ill-defined (0)

Echogenic foci: none (0)

ACR TI-RADS total points: 3.

ACR TI-RADS risk category: TR3 (3 points).

ACR TI-RADS recommendations:

**Given size (>/= 2.5 cm) and appearance, fine needle aspiration of
this mildly suspicious nodule should be considered based on TI-RADS
criteria.

_________________________________________________________

Nodule # 2:

Location: Left; Mid

Maximum size: 1.4, previously 1.4 cm; Other 2 dimensions: 1 x 1.3,
previously 1 x 1.3 cm

Composition: mixed cystic and solid (1)

Echogenicity: hypoechoic (2)

Shape: not taller-than-wide (0)

Margins: ill-defined (0)

Echogenic foci: large comet-tail artifacts (0)

ACR TI-RADS total points: 3.

ACR TI-RADS risk category: TR3 (3 points).

ACR TI-RADS recommendations:

Given size (<1.4 cm) and appearance, this nodule does NOT meet
TI-RADS criteria for biopsy or dedicated follow-up.

_________________________________________________________
IMPRESSION: 1. Thyromegaly with bilateral nodules.
2. Stable right nodule is mildly suspicious and meets criteria for
biopsy, which should be considered in context of expected clinical
benefit given advanced patient age.

The above is in keeping with the ACR TI-RADS recommendations - [HOSPITAL] 3987;[DATE].

## 2018-11-22 DIAGNOSIS — Z681 Body mass index (BMI) 19 or less, adult: Secondary | ICD-10-CM | POA: Diagnosis not present

## 2018-11-22 DIAGNOSIS — L89152 Pressure ulcer of sacral region, stage 2: Secondary | ICD-10-CM | POA: Diagnosis not present

## 2018-11-22 DIAGNOSIS — I10 Essential (primary) hypertension: Secondary | ICD-10-CM | POA: Diagnosis not present

## 2018-11-22 DIAGNOSIS — R1310 Dysphagia, unspecified: Secondary | ICD-10-CM | POA: Diagnosis not present

## 2018-11-22 DIAGNOSIS — R443 Hallucinations, unspecified: Secondary | ICD-10-CM | POA: Diagnosis not present

## 2018-11-22 DIAGNOSIS — E43 Unspecified severe protein-calorie malnutrition: Secondary | ICD-10-CM | POA: Diagnosis not present

## 2018-11-26 DIAGNOSIS — R443 Hallucinations, unspecified: Secondary | ICD-10-CM | POA: Diagnosis not present

## 2018-11-26 DIAGNOSIS — Z9181 History of falling: Secondary | ICD-10-CM | POA: Diagnosis not present

## 2018-11-26 DIAGNOSIS — Z681 Body mass index (BMI) 19 or less, adult: Secondary | ICD-10-CM | POA: Diagnosis not present

## 2018-11-26 DIAGNOSIS — I10 Essential (primary) hypertension: Secondary | ICD-10-CM | POA: Diagnosis not present

## 2018-11-26 DIAGNOSIS — E039 Hypothyroidism, unspecified: Secondary | ICD-10-CM | POA: Diagnosis not present

## 2018-11-26 DIAGNOSIS — R1310 Dysphagia, unspecified: Secondary | ICD-10-CM | POA: Diagnosis not present

## 2018-11-26 DIAGNOSIS — L89152 Pressure ulcer of sacral region, stage 2: Secondary | ICD-10-CM | POA: Diagnosis not present

## 2018-11-26 DIAGNOSIS — Z741 Need for assistance with personal care: Secondary | ICD-10-CM | POA: Diagnosis not present

## 2018-11-26 DIAGNOSIS — E43 Unspecified severe protein-calorie malnutrition: Secondary | ICD-10-CM | POA: Diagnosis not present

## 2018-11-29 DIAGNOSIS — Z681 Body mass index (BMI) 19 or less, adult: Secondary | ICD-10-CM | POA: Diagnosis not present

## 2018-11-29 DIAGNOSIS — I10 Essential (primary) hypertension: Secondary | ICD-10-CM | POA: Diagnosis not present

## 2018-11-29 DIAGNOSIS — E43 Unspecified severe protein-calorie malnutrition: Secondary | ICD-10-CM | POA: Diagnosis not present

## 2018-11-29 DIAGNOSIS — L89152 Pressure ulcer of sacral region, stage 2: Secondary | ICD-10-CM | POA: Diagnosis not present

## 2018-11-29 DIAGNOSIS — R443 Hallucinations, unspecified: Secondary | ICD-10-CM | POA: Diagnosis not present

## 2018-11-29 DIAGNOSIS — R1310 Dysphagia, unspecified: Secondary | ICD-10-CM | POA: Diagnosis not present

## 2018-12-06 DIAGNOSIS — I10 Essential (primary) hypertension: Secondary | ICD-10-CM | POA: Diagnosis not present

## 2018-12-06 DIAGNOSIS — Z7401 Bed confinement status: Secondary | ICD-10-CM | POA: Diagnosis not present

## 2018-12-06 DIAGNOSIS — G894 Chronic pain syndrome: Secondary | ICD-10-CM | POA: Diagnosis not present

## 2018-12-06 DIAGNOSIS — L89152 Pressure ulcer of sacral region, stage 2: Secondary | ICD-10-CM | POA: Diagnosis not present

## 2018-12-06 DIAGNOSIS — R1310 Dysphagia, unspecified: Secondary | ICD-10-CM | POA: Diagnosis not present

## 2018-12-06 DIAGNOSIS — Z681 Body mass index (BMI) 19 or less, adult: Secondary | ICD-10-CM | POA: Diagnosis not present

## 2018-12-06 DIAGNOSIS — E43 Unspecified severe protein-calorie malnutrition: Secondary | ICD-10-CM | POA: Diagnosis not present

## 2018-12-06 DIAGNOSIS — E440 Moderate protein-calorie malnutrition: Secondary | ICD-10-CM | POA: Diagnosis not present

## 2018-12-06 DIAGNOSIS — R443 Hallucinations, unspecified: Secondary | ICD-10-CM | POA: Diagnosis not present

## 2018-12-12 DIAGNOSIS — R1310 Dysphagia, unspecified: Secondary | ICD-10-CM | POA: Diagnosis not present

## 2018-12-12 DIAGNOSIS — E43 Unspecified severe protein-calorie malnutrition: Secondary | ICD-10-CM | POA: Diagnosis not present

## 2018-12-12 DIAGNOSIS — R443 Hallucinations, unspecified: Secondary | ICD-10-CM | POA: Diagnosis not present

## 2018-12-12 DIAGNOSIS — I10 Essential (primary) hypertension: Secondary | ICD-10-CM | POA: Diagnosis not present

## 2018-12-12 DIAGNOSIS — Z681 Body mass index (BMI) 19 or less, adult: Secondary | ICD-10-CM | POA: Diagnosis not present

## 2018-12-12 DIAGNOSIS — L89152 Pressure ulcer of sacral region, stage 2: Secondary | ICD-10-CM | POA: Diagnosis not present

## 2018-12-13 DIAGNOSIS — R443 Hallucinations, unspecified: Secondary | ICD-10-CM | POA: Diagnosis not present

## 2018-12-13 DIAGNOSIS — E43 Unspecified severe protein-calorie malnutrition: Secondary | ICD-10-CM | POA: Diagnosis not present

## 2018-12-13 DIAGNOSIS — R1310 Dysphagia, unspecified: Secondary | ICD-10-CM | POA: Diagnosis not present

## 2018-12-13 DIAGNOSIS — I10 Essential (primary) hypertension: Secondary | ICD-10-CM | POA: Diagnosis not present

## 2018-12-13 DIAGNOSIS — Z681 Body mass index (BMI) 19 or less, adult: Secondary | ICD-10-CM | POA: Diagnosis not present

## 2018-12-13 DIAGNOSIS — L89152 Pressure ulcer of sacral region, stage 2: Secondary | ICD-10-CM | POA: Diagnosis not present

## 2018-12-20 DIAGNOSIS — R443 Hallucinations, unspecified: Secondary | ICD-10-CM | POA: Diagnosis not present

## 2018-12-20 DIAGNOSIS — R1310 Dysphagia, unspecified: Secondary | ICD-10-CM | POA: Diagnosis not present

## 2018-12-20 DIAGNOSIS — L89152 Pressure ulcer of sacral region, stage 2: Secondary | ICD-10-CM | POA: Diagnosis not present

## 2018-12-20 DIAGNOSIS — Z681 Body mass index (BMI) 19 or less, adult: Secondary | ICD-10-CM | POA: Diagnosis not present

## 2018-12-20 DIAGNOSIS — E43 Unspecified severe protein-calorie malnutrition: Secondary | ICD-10-CM | POA: Diagnosis not present

## 2018-12-20 DIAGNOSIS — I10 Essential (primary) hypertension: Secondary | ICD-10-CM | POA: Diagnosis not present

## 2019-01-26 DIAGNOSIS — E039 Hypothyroidism, unspecified: Secondary | ICD-10-CM | POA: Diagnosis not present

## 2019-01-26 DIAGNOSIS — I10 Essential (primary) hypertension: Secondary | ICD-10-CM | POA: Diagnosis not present

## 2019-01-26 DIAGNOSIS — E43 Unspecified severe protein-calorie malnutrition: Secondary | ICD-10-CM | POA: Diagnosis not present

## 2019-01-26 DIAGNOSIS — Z741 Need for assistance with personal care: Secondary | ICD-10-CM | POA: Diagnosis not present

## 2019-01-26 DIAGNOSIS — R443 Hallucinations, unspecified: Secondary | ICD-10-CM | POA: Diagnosis not present

## 2019-01-26 DIAGNOSIS — Z681 Body mass index (BMI) 19 or less, adult: Secondary | ICD-10-CM | POA: Diagnosis not present

## 2019-01-26 DIAGNOSIS — L89152 Pressure ulcer of sacral region, stage 2: Secondary | ICD-10-CM | POA: Diagnosis not present

## 2019-01-26 DIAGNOSIS — L89626 Pressure-induced deep tissue damage of left heel: Secondary | ICD-10-CM | POA: Diagnosis not present

## 2019-01-26 DIAGNOSIS — Z9181 History of falling: Secondary | ICD-10-CM | POA: Diagnosis not present

## 2019-01-26 DIAGNOSIS — R1310 Dysphagia, unspecified: Secondary | ICD-10-CM | POA: Diagnosis not present

## 2019-01-31 DIAGNOSIS — R443 Hallucinations, unspecified: Secondary | ICD-10-CM | POA: Diagnosis not present

## 2019-01-31 DIAGNOSIS — E43 Unspecified severe protein-calorie malnutrition: Secondary | ICD-10-CM | POA: Diagnosis not present

## 2019-01-31 DIAGNOSIS — L89152 Pressure ulcer of sacral region, stage 2: Secondary | ICD-10-CM | POA: Diagnosis not present

## 2019-01-31 DIAGNOSIS — I10 Essential (primary) hypertension: Secondary | ICD-10-CM | POA: Diagnosis not present

## 2019-01-31 DIAGNOSIS — L89626 Pressure-induced deep tissue damage of left heel: Secondary | ICD-10-CM | POA: Diagnosis not present

## 2019-01-31 DIAGNOSIS — R1310 Dysphagia, unspecified: Secondary | ICD-10-CM | POA: Diagnosis not present

## 2019-02-07 DIAGNOSIS — R443 Hallucinations, unspecified: Secondary | ICD-10-CM | POA: Diagnosis not present

## 2019-02-07 DIAGNOSIS — I10 Essential (primary) hypertension: Secondary | ICD-10-CM | POA: Diagnosis not present

## 2019-02-07 DIAGNOSIS — L89152 Pressure ulcer of sacral region, stage 2: Secondary | ICD-10-CM | POA: Diagnosis not present

## 2019-02-07 DIAGNOSIS — E43 Unspecified severe protein-calorie malnutrition: Secondary | ICD-10-CM | POA: Diagnosis not present

## 2019-02-07 DIAGNOSIS — L89626 Pressure-induced deep tissue damage of left heel: Secondary | ICD-10-CM | POA: Diagnosis not present

## 2019-02-07 DIAGNOSIS — R1310 Dysphagia, unspecified: Secondary | ICD-10-CM | POA: Diagnosis not present

## 2019-02-21 DIAGNOSIS — L89152 Pressure ulcer of sacral region, stage 2: Secondary | ICD-10-CM | POA: Diagnosis not present

## 2019-02-21 DIAGNOSIS — R443 Hallucinations, unspecified: Secondary | ICD-10-CM | POA: Diagnosis not present

## 2019-02-21 DIAGNOSIS — I10 Essential (primary) hypertension: Secondary | ICD-10-CM | POA: Diagnosis not present

## 2019-02-21 DIAGNOSIS — R1310 Dysphagia, unspecified: Secondary | ICD-10-CM | POA: Diagnosis not present

## 2019-02-21 DIAGNOSIS — L89626 Pressure-induced deep tissue damage of left heel: Secondary | ICD-10-CM | POA: Diagnosis not present

## 2019-02-21 DIAGNOSIS — E43 Unspecified severe protein-calorie malnutrition: Secondary | ICD-10-CM | POA: Diagnosis not present

## 2019-02-26 DIAGNOSIS — Z681 Body mass index (BMI) 19 or less, adult: Secondary | ICD-10-CM | POA: Diagnosis not present

## 2019-02-26 DIAGNOSIS — E43 Unspecified severe protein-calorie malnutrition: Secondary | ICD-10-CM | POA: Diagnosis not present

## 2019-02-26 DIAGNOSIS — E039 Hypothyroidism, unspecified: Secondary | ICD-10-CM | POA: Diagnosis not present

## 2019-02-26 DIAGNOSIS — Z9181 History of falling: Secondary | ICD-10-CM | POA: Diagnosis not present

## 2019-02-26 DIAGNOSIS — Z741 Need for assistance with personal care: Secondary | ICD-10-CM | POA: Diagnosis not present

## 2019-02-26 DIAGNOSIS — R443 Hallucinations, unspecified: Secondary | ICD-10-CM | POA: Diagnosis not present

## 2019-02-26 DIAGNOSIS — R1310 Dysphagia, unspecified: Secondary | ICD-10-CM | POA: Diagnosis not present

## 2019-02-26 DIAGNOSIS — L89152 Pressure ulcer of sacral region, stage 2: Secondary | ICD-10-CM | POA: Diagnosis not present

## 2019-02-26 DIAGNOSIS — L89626 Pressure-induced deep tissue damage of left heel: Secondary | ICD-10-CM | POA: Diagnosis not present

## 2019-02-26 DIAGNOSIS — I10 Essential (primary) hypertension: Secondary | ICD-10-CM | POA: Diagnosis not present

## 2019-02-28 DIAGNOSIS — L89626 Pressure-induced deep tissue damage of left heel: Secondary | ICD-10-CM | POA: Diagnosis not present

## 2019-02-28 DIAGNOSIS — I10 Essential (primary) hypertension: Secondary | ICD-10-CM | POA: Diagnosis not present

## 2019-02-28 DIAGNOSIS — E43 Unspecified severe protein-calorie malnutrition: Secondary | ICD-10-CM | POA: Diagnosis not present

## 2019-02-28 DIAGNOSIS — R1310 Dysphagia, unspecified: Secondary | ICD-10-CM | POA: Diagnosis not present

## 2019-02-28 DIAGNOSIS — R443 Hallucinations, unspecified: Secondary | ICD-10-CM | POA: Diagnosis not present

## 2019-02-28 DIAGNOSIS — L89152 Pressure ulcer of sacral region, stage 2: Secondary | ICD-10-CM | POA: Diagnosis not present

## 2019-03-07 DIAGNOSIS — L89152 Pressure ulcer of sacral region, stage 2: Secondary | ICD-10-CM | POA: Diagnosis not present

## 2019-03-07 DIAGNOSIS — E43 Unspecified severe protein-calorie malnutrition: Secondary | ICD-10-CM | POA: Diagnosis not present

## 2019-03-07 DIAGNOSIS — R443 Hallucinations, unspecified: Secondary | ICD-10-CM | POA: Diagnosis not present

## 2019-03-07 DIAGNOSIS — I10 Essential (primary) hypertension: Secondary | ICD-10-CM | POA: Diagnosis not present

## 2019-03-07 DIAGNOSIS — R1310 Dysphagia, unspecified: Secondary | ICD-10-CM | POA: Diagnosis not present

## 2019-03-07 DIAGNOSIS — L89626 Pressure-induced deep tissue damage of left heel: Secondary | ICD-10-CM | POA: Diagnosis not present

## 2019-03-14 DIAGNOSIS — R1310 Dysphagia, unspecified: Secondary | ICD-10-CM | POA: Diagnosis not present

## 2019-03-14 DIAGNOSIS — R443 Hallucinations, unspecified: Secondary | ICD-10-CM | POA: Diagnosis not present

## 2019-03-14 DIAGNOSIS — L89626 Pressure-induced deep tissue damage of left heel: Secondary | ICD-10-CM | POA: Diagnosis not present

## 2019-03-14 DIAGNOSIS — L89152 Pressure ulcer of sacral region, stage 2: Secondary | ICD-10-CM | POA: Diagnosis not present

## 2019-03-14 DIAGNOSIS — E43 Unspecified severe protein-calorie malnutrition: Secondary | ICD-10-CM | POA: Diagnosis not present

## 2019-03-14 DIAGNOSIS — I10 Essential (primary) hypertension: Secondary | ICD-10-CM | POA: Diagnosis not present

## 2019-03-21 DIAGNOSIS — R443 Hallucinations, unspecified: Secondary | ICD-10-CM | POA: Diagnosis not present

## 2019-03-21 DIAGNOSIS — I10 Essential (primary) hypertension: Secondary | ICD-10-CM | POA: Diagnosis not present

## 2019-03-21 DIAGNOSIS — L89152 Pressure ulcer of sacral region, stage 2: Secondary | ICD-10-CM | POA: Diagnosis not present

## 2019-03-21 DIAGNOSIS — E43 Unspecified severe protein-calorie malnutrition: Secondary | ICD-10-CM | POA: Diagnosis not present

## 2019-03-21 DIAGNOSIS — L89626 Pressure-induced deep tissue damage of left heel: Secondary | ICD-10-CM | POA: Diagnosis not present

## 2019-03-21 DIAGNOSIS — R1310 Dysphagia, unspecified: Secondary | ICD-10-CM | POA: Diagnosis not present

## 2019-03-26 DIAGNOSIS — Z741 Need for assistance with personal care: Secondary | ICD-10-CM | POA: Diagnosis not present

## 2019-03-26 DIAGNOSIS — R443 Hallucinations, unspecified: Secondary | ICD-10-CM | POA: Diagnosis not present

## 2019-03-26 DIAGNOSIS — R1310 Dysphagia, unspecified: Secondary | ICD-10-CM | POA: Diagnosis not present

## 2019-03-26 DIAGNOSIS — L89626 Pressure-induced deep tissue damage of left heel: Secondary | ICD-10-CM | POA: Diagnosis not present

## 2019-03-26 DIAGNOSIS — Z681 Body mass index (BMI) 19 or less, adult: Secondary | ICD-10-CM | POA: Diagnosis not present

## 2019-03-26 DIAGNOSIS — E039 Hypothyroidism, unspecified: Secondary | ICD-10-CM | POA: Diagnosis not present

## 2019-03-26 DIAGNOSIS — Z9181 History of falling: Secondary | ICD-10-CM | POA: Diagnosis not present

## 2019-03-26 DIAGNOSIS — E43 Unspecified severe protein-calorie malnutrition: Secondary | ICD-10-CM | POA: Diagnosis not present

## 2019-03-26 DIAGNOSIS — I10 Essential (primary) hypertension: Secondary | ICD-10-CM | POA: Diagnosis not present

## 2019-03-26 DIAGNOSIS — L89152 Pressure ulcer of sacral region, stage 2: Secondary | ICD-10-CM | POA: Diagnosis not present

## 2019-04-02 DIAGNOSIS — Z7401 Bed confinement status: Secondary | ICD-10-CM | POA: Diagnosis not present

## 2019-04-02 DIAGNOSIS — E43 Unspecified severe protein-calorie malnutrition: Secondary | ICD-10-CM | POA: Diagnosis not present

## 2019-04-02 DIAGNOSIS — G894 Chronic pain syndrome: Secondary | ICD-10-CM | POA: Diagnosis not present

## 2019-04-02 DIAGNOSIS — I10 Essential (primary) hypertension: Secondary | ICD-10-CM | POA: Diagnosis not present

## 2019-04-22 ENCOUNTER — Emergency Department (HOSPITAL_COMMUNITY)
Admission: EM | Admit: 2019-04-22 | Discharge: 2019-04-22 | Disposition: A | Discharge status: Home / Self Care | Attending: Emergency Medicine | Admitting: Emergency Medicine

## 2019-04-22 ENCOUNTER — Emergency Department (HOSPITAL_COMMUNITY)

## 2019-04-22 DIAGNOSIS — W06XXXA Fall from bed, initial encounter: Secondary | ICD-10-CM | POA: Insufficient documentation

## 2019-04-22 DIAGNOSIS — I10 Essential (primary) hypertension: Secondary | ICD-10-CM | POA: Diagnosis not present

## 2019-04-22 DIAGNOSIS — R404 Transient alteration of awareness: Secondary | ICD-10-CM | POA: Diagnosis not present

## 2019-04-22 DIAGNOSIS — M25539 Pain in unspecified wrist: Secondary | ICD-10-CM | POA: Diagnosis not present

## 2019-04-22 DIAGNOSIS — Z79899 Other long term (current) drug therapy: Secondary | ICD-10-CM | POA: Diagnosis not present

## 2019-04-22 DIAGNOSIS — M25552 Pain in left hip: Secondary | ICD-10-CM | POA: Diagnosis not present

## 2019-04-22 DIAGNOSIS — S01511A Laceration without foreign body of lip, initial encounter: Secondary | ICD-10-CM | POA: Diagnosis not present

## 2019-04-22 DIAGNOSIS — Z23 Encounter for immunization: Secondary | ICD-10-CM | POA: Diagnosis not present

## 2019-04-22 DIAGNOSIS — M79642 Pain in left hand: Secondary | ICD-10-CM | POA: Diagnosis not present

## 2019-04-22 DIAGNOSIS — Y999 Unspecified external cause status: Secondary | ICD-10-CM | POA: Diagnosis not present

## 2019-04-22 DIAGNOSIS — M25572 Pain in left ankle and joints of left foot: Secondary | ICD-10-CM | POA: Diagnosis not present

## 2019-04-22 DIAGNOSIS — Y92003 Bedroom of unspecified non-institutional (private) residence as the place of occurrence of the external cause: Secondary | ICD-10-CM | POA: Diagnosis not present

## 2019-04-22 DIAGNOSIS — S99912A Unspecified injury of left ankle, initial encounter: Secondary | ICD-10-CM | POA: Diagnosis not present

## 2019-04-22 DIAGNOSIS — S0083XA Contusion of other part of head, initial encounter: Secondary | ICD-10-CM

## 2019-04-22 DIAGNOSIS — S0990XA Unspecified injury of head, initial encounter: Secondary | ICD-10-CM | POA: Diagnosis not present

## 2019-04-22 DIAGNOSIS — M5022 Other cervical disc displacement, mid-cervical region, unspecified level: Secondary | ICD-10-CM | POA: Diagnosis not present

## 2019-04-22 DIAGNOSIS — S81812A Laceration without foreign body, left lower leg, initial encounter: Secondary | ICD-10-CM | POA: Diagnosis not present

## 2019-04-22 DIAGNOSIS — G44309 Post-traumatic headache, unspecified, not intractable: Secondary | ICD-10-CM | POA: Diagnosis not present

## 2019-04-22 DIAGNOSIS — Y939 Activity, unspecified: Secondary | ICD-10-CM | POA: Diagnosis not present

## 2019-04-22 DIAGNOSIS — M2550 Pain in unspecified joint: Secondary | ICD-10-CM | POA: Diagnosis not present

## 2019-04-22 DIAGNOSIS — S6992XA Unspecified injury of left wrist, hand and finger(s), initial encounter: Secondary | ICD-10-CM | POA: Diagnosis not present

## 2019-04-22 DIAGNOSIS — S7002XA Contusion of left hip, initial encounter: Secondary | ICD-10-CM

## 2019-04-22 DIAGNOSIS — R52 Pain, unspecified: Secondary | ICD-10-CM | POA: Diagnosis not present

## 2019-04-22 DIAGNOSIS — S0181XA Laceration without foreign body of other part of head, initial encounter: Secondary | ICD-10-CM

## 2019-04-22 DIAGNOSIS — Z7401 Bed confinement status: Secondary | ICD-10-CM | POA: Diagnosis not present

## 2019-04-22 DIAGNOSIS — M25519 Pain in unspecified shoulder: Secondary | ICD-10-CM | POA: Diagnosis not present

## 2019-04-22 DIAGNOSIS — R000 Tachycardia, unspecified: Secondary | ICD-10-CM | POA: Diagnosis not present

## 2019-04-22 MED ORDER — TETANUS-DIPHTH-ACELL PERTUSSIS 5-2.5-18.5 LF-MCG/0.5 IM SUSP
0.5000 mL | Freq: Once | INTRAMUSCULAR | Status: AC
  Administered 2019-04-22: 0.5 mL via INTRAMUSCULAR
  Filled 2019-04-22: qty 0.5

## 2019-04-22 MED ORDER — LIDOCAINE HCL (PF) 1 % IJ SOLN
5.0000 mL | Freq: Once | INTRAMUSCULAR | Status: AC
  Administered 2019-04-22: 06:00:00 5 mL via INTRADERMAL
  Filled 2019-04-22: qty 5

## 2019-04-22 MED ORDER — LIDOCAINE-EPINEPHRINE (PF) 2 %-1:200000 IJ SOLN
20.0000 mL | Freq: Once | INTRAMUSCULAR | Status: AC
  Administered 2019-04-22: 20 mL via INTRADERMAL
  Filled 2019-04-22: qty 20

## 2019-04-22 NOTE — ED Provider Notes (Signed)
X-rays are reviewed with the patient and her caregiver.  CT scan without acute findings.  C-collar removed.  Stable for discharge.   Dorie Rank, MD 04/22/19 [phone]

## 2019-04-22 NOTE — Discharge Instructions (Signed)
Take Tylenol as needed for pain.  Follow-up with your doctor if the symptoms are not improving and follow-up with your doctor for suture removal in 5 days.

## 2019-04-22 NOTE — ED Notes (Signed)
Ortho tech called to place cam walker.

## 2019-04-22 NOTE — ED Provider Notes (Signed)
TIME SEEN: 5:37 AM  CHIEF COMPLAINT: Fall  HPI: Patient is a 86 year old female with history of hypertension, hyperlipidemia who lives at home 24/7 care who presents to the emergency department EMS after she had a fall.  States she got up from bed and slipped and fell.  States she hit her nightstand and knocked it over.  No loss of consciousness.  Not on blood thinners.  Has a left lip laceration and facial tenderness.  Has a medial distal left lower extremity laceration.  Complaining also of left hand and wrist pain.  Does have diffuse pain all over chronically due to arthritis.  No numbness or weakness.  Does not normally use a cane or walker at home.  ROS: See HPI Constitutional: no fever  Eyes: no drainage  ENT: no runny nose   Cardiovascular:  no chest pain  Resp: no SOB  GI: no vomiting GU: no dysuria Integumentary: no rash  Allergy: no hives  Musculoskeletal: no leg swelling  Neurological: no slurred speech ROS otherwise negative  PAST MEDICAL HISTORY/PAST SURGICAL HISTORY:  Past Medical History:  Diagnosis Date  . Allergy   . Arthritis   . Arthritis   . Basal cell carcinoma    on nose  . Hyperlipemia   . Hypertension   . Osteoporosis 05/2015   T score -3.2  . Urine incontinence     MEDICATIONS:  Prior to Admission medications   Medication Sig Start Date End Date Taking? Authorizing Provider  amLODipine (NORVASC) 5 MG tablet TAKE ONE TABLET BY MOUTH ONCE DAILY 09/20/16   Golden Circle, FNP  levothyroxine (SYNTHROID) 25 MCG tablet Take 25 mcg by mouth daily before breakfast. Brand name    [provider]  lisinopril (PRINIVIL,ZESTRIL) 5 MG tablet TAKE ONE TABLET BY MOUTH ONCE DAILY 09/20/16   Golden Circle, FNP  naproxen sodium (ALEVE) 220 MG tablet Take 220 mg by mouth as needed.    [provider]  oxybutynin (DITROPAN) 5 MG tablet Take 0.5-1 tablets (2.5-5 mg total) by mouth at bedtime. Patient not taking: Reported on 06/14/2017 09/28/16    Golden Circle, FNP    ALLERGIES:  Allergies  Allergen Reactions  . Penicillins Hives and Shortness Of Breath  . Sulfur Hives and Shortness Of Breath  . Codeine Other (See Comments)    "Spaced out" / "Altered reality"    SOCIAL HISTORY:  Social History   Tobacco Use  . Smoking status: Never Smoker  . Smokeless tobacco: Never Used  Substance Use Topics  . Alcohol use: No    Comment: Rare    FAMILY HISTORY: Family History  Problem Relation Age of Onset  . Ovarian cancer Mother   . Cancer Mother        Ovarian/Uterine Cancer  . Thyroid disease Daughter   . Diabetes Neg Hx   . Stroke Neg Hx   . Colon cancer Neg Hx   . Rectal cancer Neg Hx   . Stomach cancer Neg Hx     EXAM: BP (!) 160/80   Pulse 74   Temp 97.9 F (36.6 C) (Oral)   Resp 16   SpO2 98%  CONSTITUTIONAL: Alert and oriented and responds appropriately to questions.  Elderly.  In no distress. HEAD: Normocephalic; 2 cm well approximated laceration to the upper left lip  EYES: Conjunctivae clear, PERRL, EOMI ENT: normal nose; no rhinorrhea; moist mucous membranes; pharynx without lesions noted; no dental injury; no septal hematoma, her dental implants are intact and not  loose, there is a small area of bruising to the left upper gum without active bleeding NECK: Supple, no meningismus, no LAD; no midline spinal tenderness, step-off or deformity; trachea midline, cervical collar in place CARD: RRR; S1 and S2 appreciated; no murmurs, no clicks, no rubs, no gallops RESP: Normal chest excursion without splinting or tachypnea; breath sounds clear and equal bilaterally; no wheezes, no rhonchi, no rales; no hypoxia or respiratory distress CHEST:  chest wall stable, no crepitus or ecchymosis or deformity, nontender to palpation; no flail chest ABD/GI: Normal bowel sounds; non-distended; soft, non-tender, no rebound, no guarding; no ecchymosis or other lesions noted PELVIS:  stable, nontender to palpation BACK:  The  back appears normal and is non-tender to palpation, there is no CVA tenderness; no midline spinal tenderness, step-off or deformity EXT: Tender over the dorsal left hand and wrist without deformity.  No scaphoid tenderness.  She also has some tenderness over the distal left tibia and medial ankle without deformity.  2+ radial and DP pulses bilaterally.  Otherwise extremities nontender.  Compartments soft.  Patient has approximately 10 cm superficial laceration to the inner left distal lower extremity with fat exposure but no muscle or tendon injury.  She has full range of motion in her toes and foot on the left side.  Normal plantar and dorsiflexion of the left foot. SKIN: Normal color for age and race; warm NEURO: Moves all extremities equally, normal speech, no facial asymmetry noted, normal sensation diffusely, cranial nerves II through XII appear intact PSYCH: The patient's mood and manner are appropriate. Grooming and personal hygiene are appropriate.  MEDICAL DECISION MAKING: Patient here with mechanical fall at home.  Has laceration to the left upper lip and left lower extremity.  Will update tetanus vaccination and repair.  No sign of foreign body, tendon or bony injury.  Will obtain x-rays of her left hand, wrist, ankle and tibia.  Will also obtain CT of the head, face and cervical spine.  She declines pain medication.  Nursing staff concerned that patient appeared to have facial droop which I do not appreciate on my exam.  I do not appreciate any focal neurologic deficits nor does patient.  ED PROGRESS: X-ray shows insufficiency fracture of the distal tibial metaphysis.  She does report she has bony tenderness in this area.  Will place in a cam walker.  Caregiver at bedside states that the patient does not normally ambulate but only uses a walker to stand up out of bed to pivot to get to the potty chair.  Patient still declines any pain medication.  There was also concern for discontinuity of the  Achilles tendon but she has full plantar and dorsiflexion of the left foot.  Lacerations have been repaired and she states she is feeling better.  CT scans pending.  Signed out to Dr. Tomi Bamberger to follow-up on CT imaging but anticipate if unremarkable, patient will be discharged home.   I reviewed all nursing notes and pertinent previous records as available.  I have interpreted any EKGs, lab and urine results, imaging (as available).    Summer Rodriguez was evaluated in Emergency Department on 04/22/2019 for the symptoms described in the history of present illness. She was evaluated in the context of the global COVID-19 pandemic, which necessitated consideration that the patient might be at risk for infection with the SARS-CoV-2 virus that causes COVID-19. Institutional protocols and algorithms that pertain to the evaluation of patients at risk for COVID-19 are in a state  of rapid change based on information released by regulatory bodies including the CDC and federal and state organizations. These policies and algorithms were followed during the patient's care in the ED.  Patient was seen wearing N95, face shield, gloves.     Cardell Rachel, Delice Bison, DO 04/22/19 [phone]

## 2019-04-22 NOTE — Progress Notes (Signed)
[facility]  Ms Summer Rodriguez is our current hospice patient.   If you have any questions or concerns please feel free to call me or send me a chat message in epic.   [facility] [place], [place], BSN [phone]

## 2019-04-22 NOTE — ED Triage Notes (Signed)
Pt arrives via [facility] from home   Pt states she was attempting to get out of bed and tripped on a table when she fell.  Pt sustains laceration to inner left ankle/leg, lac to upper lip with detachment of crowns, and left face asymmetry.   Pt dementia at baseline

## 2019-04-22 NOTE — Progress Notes (Signed)
Orthopedic Tech Progress Note Patient Details:  Summer Rodriguez 1921-12-28 [id number]  Ortho Devices Type of Ortho Device: CAM walker Ortho Device/Splint Location: left Ortho Device/Splint Interventions: Application   Post Interventions Patient Tolerated: Well Instructions Provided: Care of device   [name] 04/22/2019, 9:32 AM

## 2019-04-22 NOTE — ED Provider Notes (Signed)
..Laceration Repair  Date/Time: 04/22/2019 6:09 AM Performed by: Abigail Butts, PA-C Authorized by: Abigail Butts, PA-C   Consent:    Consent obtained:  Verbal   Consent given by:  Patient   Risks discussed:  Infection, need for additional repair, pain, poor cosmetic result and poor wound healing   Alternatives discussed:  No treatment and delayed treatment Universal protocol:    Procedure explained and questions answered to patient or proxy's satisfaction: yes     Relevant documents present and verified: yes     Test results available and properly labeled: yes     Imaging studies available: yes     Required blood products, implants, devices, and special equipment available: yes     Site/side marked: yes     Immediately prior to procedure, a time out was called: yes     Patient identity confirmed:  Verbally with patient Anesthesia (see MAR for exact dosages):    Anesthesia method:  Local infiltration   Local anesthetic:  Lidocaine 1% w/o epi Laceration details:    Location:  Lip   Lip location:  Upper exterior lip   Length (cm):  3 Repair type:    Repair type:  Simple Pre-procedure details:    Preparation:  Patient was prepped and draped in usual sterile fashion Exploration:    Hemostasis achieved with:  Direct pressure   Wound exploration: wound explored through full range of motion and entire depth of wound probed and visualized   Treatment:    Area cleansed with:  Saline   Amount of cleaning:  Standard   Irrigation solution:  Sterile water   Irrigation volume:  275mL   Irrigation method:  Syringe Skin repair:    Repair method:  Sutures   Suture size:  6-0   Suture material:  Prolene   Suture technique:  Simple interrupted   Number of sutures:  3 Approximation:    Approximation:  Close   Vermilion border: poorly aligned   Post-procedure details:    Dressing:  Open (no dressing)   Patient tolerance of procedure:  Tolerated well, no immediate  complications  .Marland KitchenLaceration Repair  Date/Time: 04/22/2019 7:31 AM Performed by: Abigail Butts, PA-C Authorized by: Abigail Butts, PA-C   Consent:    Consent obtained:  Verbal   Consent given by:  Patient   Risks discussed:  Infection, need for additional repair, pain, poor cosmetic result and poor wound healing   Alternatives discussed:  No treatment and delayed treatment Universal protocol:    Procedure explained and questions answered to patient or proxy's satisfaction: yes     Relevant documents present and verified: yes     Test results available and properly labeled: yes     Imaging studies available: yes     Required blood products, implants, devices, and special equipment available: yes     Site/side marked: yes     Immediately prior to procedure, a time out was called: yes     Patient identity confirmed:  Verbally with patient Anesthesia (see MAR for exact dosages):    Anesthesia method:  Local infiltration   Local anesthetic:  Lidocaine 2% WITH epi Laceration details:    Location:  Leg   Leg location:  L lower leg   Length (cm):  12 Repair type:    Repair type:  Intermediate Pre-procedure details:    Preparation:  Imaging obtained to evaluate for foreign bodies and patient was prepped and draped in usual sterile fashion Exploration:    Hemostasis achieved  with:  Epinephrine and direct pressure   Wound exploration: wound explored through full range of motion and entire depth of wound probed and visualized   Treatment:    Area cleansed with:  Saline   Amount of cleaning:  Standard   Irrigation solution:  Sterile water   Irrigation volume:  550mL   Irrigation method:  Syringe Skin repair:    Repair method:  Sutures   Suture size:  4-0   Suture material:  Prolene   Suture technique:  Horizontal mattress and simple interrupted   Number of sutures:  9 Approximation:    Approximation:  Close Post-procedure details:    Dressing:  Non-adherent  dressing   Patient tolerance of procedure:  Tolerated well, no immediate complications              Summer Rodriguez 04/22/19 0732    Ward, Delice Bison, DO 04/22/19 [phone]

## 2019-04-22 NOTE — ED Notes (Signed)
Called [facility] for pt transport  

## 2019-04-26 DIAGNOSIS — Z9181 History of falling: Secondary | ICD-10-CM | POA: Diagnosis not present

## 2019-04-26 DIAGNOSIS — R1310 Dysphagia, unspecified: Secondary | ICD-10-CM | POA: Diagnosis not present

## 2019-04-26 DIAGNOSIS — E43 Unspecified severe protein-calorie malnutrition: Secondary | ICD-10-CM | POA: Diagnosis not present

## 2019-04-26 DIAGNOSIS — R443 Hallucinations, unspecified: Secondary | ICD-10-CM | POA: Diagnosis not present

## 2019-04-26 DIAGNOSIS — Z741 Need for assistance with personal care: Secondary | ICD-10-CM | POA: Diagnosis not present

## 2019-04-26 DIAGNOSIS — E039 Hypothyroidism, unspecified: Secondary | ICD-10-CM | POA: Diagnosis not present

## 2019-04-26 DIAGNOSIS — I10 Essential (primary) hypertension: Secondary | ICD-10-CM | POA: Diagnosis not present

## 2019-04-26 DIAGNOSIS — Z681 Body mass index (BMI) 19 or less, adult: Secondary | ICD-10-CM | POA: Diagnosis not present

## 2019-04-27 DIAGNOSIS — I10 Essential (primary) hypertension: Secondary | ICD-10-CM | POA: Diagnosis not present

## 2019-04-27 DIAGNOSIS — R1310 Dysphagia, unspecified: Secondary | ICD-10-CM | POA: Diagnosis not present

## 2019-04-27 DIAGNOSIS — R443 Hallucinations, unspecified: Secondary | ICD-10-CM | POA: Diagnosis not present

## 2019-04-27 DIAGNOSIS — Z9181 History of falling: Secondary | ICD-10-CM | POA: Diagnosis not present

## 2019-04-27 DIAGNOSIS — E43 Unspecified severe protein-calorie malnutrition: Secondary | ICD-10-CM | POA: Diagnosis not present

## 2019-04-27 DIAGNOSIS — Z681 Body mass index (BMI) 19 or less, adult: Secondary | ICD-10-CM | POA: Diagnosis not present

## 2019-05-02 DIAGNOSIS — R443 Hallucinations, unspecified: Secondary | ICD-10-CM | POA: Diagnosis not present

## 2019-05-02 DIAGNOSIS — Z9181 History of falling: Secondary | ICD-10-CM | POA: Diagnosis not present

## 2019-05-02 DIAGNOSIS — R1310 Dysphagia, unspecified: Secondary | ICD-10-CM | POA: Diagnosis not present

## 2019-05-02 DIAGNOSIS — Z681 Body mass index (BMI) 19 or less, adult: Secondary | ICD-10-CM | POA: Diagnosis not present

## 2019-05-02 DIAGNOSIS — E43 Unspecified severe protein-calorie malnutrition: Secondary | ICD-10-CM | POA: Diagnosis not present

## 2019-05-02 DIAGNOSIS — I10 Essential (primary) hypertension: Secondary | ICD-10-CM | POA: Diagnosis not present

## 2019-05-09 DIAGNOSIS — E43 Unspecified severe protein-calorie malnutrition: Secondary | ICD-10-CM | POA: Diagnosis not present

## 2019-05-09 DIAGNOSIS — R1310 Dysphagia, unspecified: Secondary | ICD-10-CM | POA: Diagnosis not present

## 2019-05-09 DIAGNOSIS — I10 Essential (primary) hypertension: Secondary | ICD-10-CM | POA: Diagnosis not present

## 2019-05-09 DIAGNOSIS — Z681 Body mass index (BMI) 19 or less, adult: Secondary | ICD-10-CM | POA: Diagnosis not present

## 2019-05-09 DIAGNOSIS — Z9181 History of falling: Secondary | ICD-10-CM | POA: Diagnosis not present

## 2019-05-09 DIAGNOSIS — R443 Hallucinations, unspecified: Secondary | ICD-10-CM | POA: Diagnosis not present

## 2019-05-16 DIAGNOSIS — Z9181 History of falling: Secondary | ICD-10-CM | POA: Diagnosis not present

## 2019-05-16 DIAGNOSIS — E43 Unspecified severe protein-calorie malnutrition: Secondary | ICD-10-CM | POA: Diagnosis not present

## 2019-05-16 DIAGNOSIS — I10 Essential (primary) hypertension: Secondary | ICD-10-CM | POA: Diagnosis not present

## 2019-05-16 DIAGNOSIS — Z681 Body mass index (BMI) 19 or less, adult: Secondary | ICD-10-CM | POA: Diagnosis not present

## 2019-05-16 DIAGNOSIS — R1310 Dysphagia, unspecified: Secondary | ICD-10-CM | POA: Diagnosis not present

## 2019-05-16 DIAGNOSIS — R443 Hallucinations, unspecified: Secondary | ICD-10-CM | POA: Diagnosis not present

## 2019-05-23 DIAGNOSIS — Z9181 History of falling: Secondary | ICD-10-CM | POA: Diagnosis not present

## 2019-05-23 DIAGNOSIS — R1310 Dysphagia, unspecified: Secondary | ICD-10-CM | POA: Diagnosis not present

## 2019-05-23 DIAGNOSIS — R443 Hallucinations, unspecified: Secondary | ICD-10-CM | POA: Diagnosis not present

## 2019-05-23 DIAGNOSIS — Z681 Body mass index (BMI) 19 or less, adult: Secondary | ICD-10-CM | POA: Diagnosis not present

## 2019-05-23 DIAGNOSIS — I10 Essential (primary) hypertension: Secondary | ICD-10-CM | POA: Diagnosis not present

## 2019-05-23 DIAGNOSIS — E43 Unspecified severe protein-calorie malnutrition: Secondary | ICD-10-CM | POA: Diagnosis not present

## 2019-05-26 DIAGNOSIS — Z681 Body mass index (BMI) 19 or less, adult: Secondary | ICD-10-CM | POA: Diagnosis not present

## 2019-05-26 DIAGNOSIS — R1310 Dysphagia, unspecified: Secondary | ICD-10-CM | POA: Diagnosis not present

## 2019-05-26 DIAGNOSIS — I10 Essential (primary) hypertension: Secondary | ICD-10-CM | POA: Diagnosis not present

## 2019-05-26 DIAGNOSIS — R443 Hallucinations, unspecified: Secondary | ICD-10-CM | POA: Diagnosis not present

## 2019-05-26 DIAGNOSIS — E43 Unspecified severe protein-calorie malnutrition: Secondary | ICD-10-CM | POA: Diagnosis not present

## 2019-05-26 DIAGNOSIS — E039 Hypothyroidism, unspecified: Secondary | ICD-10-CM | POA: Diagnosis not present

## 2019-05-26 DIAGNOSIS — Z9181 History of falling: Secondary | ICD-10-CM | POA: Diagnosis not present

## 2019-05-26 DIAGNOSIS — Z741 Need for assistance with personal care: Secondary | ICD-10-CM | POA: Diagnosis not present

## 2019-05-30 DIAGNOSIS — E43 Unspecified severe protein-calorie malnutrition: Secondary | ICD-10-CM | POA: Diagnosis not present

## 2019-05-30 DIAGNOSIS — R1310 Dysphagia, unspecified: Secondary | ICD-10-CM | POA: Diagnosis not present

## 2019-05-30 DIAGNOSIS — Z9181 History of falling: Secondary | ICD-10-CM | POA: Diagnosis not present

## 2019-05-30 DIAGNOSIS — R443 Hallucinations, unspecified: Secondary | ICD-10-CM | POA: Diagnosis not present

## 2019-05-30 DIAGNOSIS — Z681 Body mass index (BMI) 19 or less, adult: Secondary | ICD-10-CM | POA: Diagnosis not present

## 2019-05-30 DIAGNOSIS — I10 Essential (primary) hypertension: Secondary | ICD-10-CM | POA: Diagnosis not present

## 2019-06-06 DIAGNOSIS — Z681 Body mass index (BMI) 19 or less, adult: Secondary | ICD-10-CM | POA: Diagnosis not present

## 2019-06-06 DIAGNOSIS — R1310 Dysphagia, unspecified: Secondary | ICD-10-CM | POA: Diagnosis not present

## 2019-06-06 DIAGNOSIS — R443 Hallucinations, unspecified: Secondary | ICD-10-CM | POA: Diagnosis not present

## 2019-06-06 DIAGNOSIS — Z9181 History of falling: Secondary | ICD-10-CM | POA: Diagnosis not present

## 2019-06-06 DIAGNOSIS — E43 Unspecified severe protein-calorie malnutrition: Secondary | ICD-10-CM | POA: Diagnosis not present

## 2019-06-06 DIAGNOSIS — I10 Essential (primary) hypertension: Secondary | ICD-10-CM | POA: Diagnosis not present

## 2019-06-13 DIAGNOSIS — E43 Unspecified severe protein-calorie malnutrition: Secondary | ICD-10-CM | POA: Diagnosis not present

## 2019-06-13 DIAGNOSIS — R443 Hallucinations, unspecified: Secondary | ICD-10-CM | POA: Diagnosis not present

## 2019-06-13 DIAGNOSIS — Z9181 History of falling: Secondary | ICD-10-CM | POA: Diagnosis not present

## 2019-06-13 DIAGNOSIS — R1310 Dysphagia, unspecified: Secondary | ICD-10-CM | POA: Diagnosis not present

## 2019-06-13 DIAGNOSIS — I10 Essential (primary) hypertension: Secondary | ICD-10-CM | POA: Diagnosis not present

## 2019-06-13 DIAGNOSIS — Z681 Body mass index (BMI) 19 or less, adult: Secondary | ICD-10-CM | POA: Diagnosis not present

## 2019-06-20 DIAGNOSIS — Z9181 History of falling: Secondary | ICD-10-CM | POA: Diagnosis not present

## 2019-06-20 DIAGNOSIS — E43 Unspecified severe protein-calorie malnutrition: Secondary | ICD-10-CM | POA: Diagnosis not present

## 2019-06-20 DIAGNOSIS — I10 Essential (primary) hypertension: Secondary | ICD-10-CM | POA: Diagnosis not present

## 2019-06-20 DIAGNOSIS — Z681 Body mass index (BMI) 19 or less, adult: Secondary | ICD-10-CM | POA: Diagnosis not present

## 2019-06-20 DIAGNOSIS — R443 Hallucinations, unspecified: Secondary | ICD-10-CM | POA: Diagnosis not present

## 2019-06-20 DIAGNOSIS — R1310 Dysphagia, unspecified: Secondary | ICD-10-CM | POA: Diagnosis not present

## 2019-07-26 DIAGNOSIS — R112 Nausea with vomiting, unspecified: Secondary | ICD-10-CM | POA: Diagnosis not present

## 2019-07-26 DIAGNOSIS — R1310 Dysphagia, unspecified: Secondary | ICD-10-CM | POA: Diagnosis not present

## 2019-07-26 DIAGNOSIS — S01511D Laceration without foreign body of lip, subsequent encounter: Secondary | ICD-10-CM | POA: Diagnosis not present

## 2019-07-26 DIAGNOSIS — I10 Essential (primary) hypertension: Secondary | ICD-10-CM | POA: Diagnosis not present

## 2019-07-26 DIAGNOSIS — Z681 Body mass index (BMI) 19 or less, adult: Secondary | ICD-10-CM | POA: Diagnosis not present

## 2019-07-26 DIAGNOSIS — Z741 Need for assistance with personal care: Secondary | ICD-10-CM | POA: Diagnosis not present

## 2019-07-26 DIAGNOSIS — E43 Unspecified severe protein-calorie malnutrition: Secondary | ICD-10-CM | POA: Diagnosis not present

## 2019-07-26 DIAGNOSIS — Z9181 History of falling: Secondary | ICD-10-CM | POA: Diagnosis not present

## 2019-07-26 DIAGNOSIS — S81812D Laceration without foreign body, left lower leg, subsequent encounter: Secondary | ICD-10-CM | POA: Diagnosis not present

## 2019-07-26 DIAGNOSIS — E039 Hypothyroidism, unspecified: Secondary | ICD-10-CM | POA: Diagnosis not present

## 2019-07-26 DIAGNOSIS — R443 Hallucinations, unspecified: Secondary | ICD-10-CM | POA: Diagnosis not present

## 2019-07-30 DIAGNOSIS — I10 Essential (primary) hypertension: Secondary | ICD-10-CM | POA: Diagnosis not present

## 2019-07-30 DIAGNOSIS — R1310 Dysphagia, unspecified: Secondary | ICD-10-CM | POA: Diagnosis not present

## 2019-07-30 DIAGNOSIS — S81812D Laceration without foreign body, left lower leg, subsequent encounter: Secondary | ICD-10-CM | POA: Diagnosis not present

## 2019-07-30 DIAGNOSIS — R443 Hallucinations, unspecified: Secondary | ICD-10-CM | POA: Diagnosis not present

## 2019-07-30 DIAGNOSIS — E43 Unspecified severe protein-calorie malnutrition: Secondary | ICD-10-CM | POA: Diagnosis not present

## 2019-07-30 DIAGNOSIS — S01511D Laceration without foreign body of lip, subsequent encounter: Secondary | ICD-10-CM | POA: Diagnosis not present

## 2019-08-01 DIAGNOSIS — R1310 Dysphagia, unspecified: Secondary | ICD-10-CM | POA: Diagnosis not present

## 2019-08-01 DIAGNOSIS — S81812D Laceration without foreign body, left lower leg, subsequent encounter: Secondary | ICD-10-CM | POA: Diagnosis not present

## 2019-08-01 DIAGNOSIS — R443 Hallucinations, unspecified: Secondary | ICD-10-CM | POA: Diagnosis not present

## 2019-08-01 DIAGNOSIS — E43 Unspecified severe protein-calorie malnutrition: Secondary | ICD-10-CM | POA: Diagnosis not present

## 2019-08-01 DIAGNOSIS — S01511D Laceration without foreign body of lip, subsequent encounter: Secondary | ICD-10-CM | POA: Diagnosis not present

## 2019-08-01 DIAGNOSIS — I10 Essential (primary) hypertension: Secondary | ICD-10-CM | POA: Diagnosis not present

## 2019-08-02 DIAGNOSIS — E43 Unspecified severe protein-calorie malnutrition: Secondary | ICD-10-CM | POA: Diagnosis not present

## 2019-08-02 DIAGNOSIS — I10 Essential (primary) hypertension: Secondary | ICD-10-CM | POA: Diagnosis not present

## 2019-08-02 DIAGNOSIS — S01511D Laceration without foreign body of lip, subsequent encounter: Secondary | ICD-10-CM | POA: Diagnosis not present

## 2019-08-02 DIAGNOSIS — S81812D Laceration without foreign body, left lower leg, subsequent encounter: Secondary | ICD-10-CM | POA: Diagnosis not present

## 2019-08-02 DIAGNOSIS — R443 Hallucinations, unspecified: Secondary | ICD-10-CM | POA: Diagnosis not present

## 2019-08-02 DIAGNOSIS — R1310 Dysphagia, unspecified: Secondary | ICD-10-CM | POA: Diagnosis not present

## 2019-08-06 DIAGNOSIS — S01511D Laceration without foreign body of lip, subsequent encounter: Secondary | ICD-10-CM | POA: Diagnosis not present

## 2019-08-06 DIAGNOSIS — E43 Unspecified severe protein-calorie malnutrition: Secondary | ICD-10-CM | POA: Diagnosis not present

## 2019-08-06 DIAGNOSIS — R1310 Dysphagia, unspecified: Secondary | ICD-10-CM | POA: Diagnosis not present

## 2019-08-06 DIAGNOSIS — R443 Hallucinations, unspecified: Secondary | ICD-10-CM | POA: Diagnosis not present

## 2019-08-06 DIAGNOSIS — S81812D Laceration without foreign body, left lower leg, subsequent encounter: Secondary | ICD-10-CM | POA: Diagnosis not present

## 2019-08-06 DIAGNOSIS — I10 Essential (primary) hypertension: Secondary | ICD-10-CM | POA: Diagnosis not present

## 2019-08-08 DIAGNOSIS — R443 Hallucinations, unspecified: Secondary | ICD-10-CM | POA: Diagnosis not present

## 2019-08-08 DIAGNOSIS — E43 Unspecified severe protein-calorie malnutrition: Secondary | ICD-10-CM | POA: Diagnosis not present

## 2019-08-08 DIAGNOSIS — I10 Essential (primary) hypertension: Secondary | ICD-10-CM | POA: Diagnosis not present

## 2019-08-08 DIAGNOSIS — R1310 Dysphagia, unspecified: Secondary | ICD-10-CM | POA: Diagnosis not present

## 2019-08-08 DIAGNOSIS — S01511D Laceration without foreign body of lip, subsequent encounter: Secondary | ICD-10-CM | POA: Diagnosis not present

## 2019-08-08 DIAGNOSIS — S81812D Laceration without foreign body, left lower leg, subsequent encounter: Secondary | ICD-10-CM | POA: Diagnosis not present

## 2019-08-09 DIAGNOSIS — E43 Unspecified severe protein-calorie malnutrition: Secondary | ICD-10-CM | POA: Diagnosis not present

## 2019-08-09 DIAGNOSIS — S01511D Laceration without foreign body of lip, subsequent encounter: Secondary | ICD-10-CM | POA: Diagnosis not present

## 2019-08-09 DIAGNOSIS — R443 Hallucinations, unspecified: Secondary | ICD-10-CM | POA: Diagnosis not present

## 2019-08-09 DIAGNOSIS — I10 Essential (primary) hypertension: Secondary | ICD-10-CM | POA: Diagnosis not present

## 2019-08-09 DIAGNOSIS — S81812D Laceration without foreign body, left lower leg, subsequent encounter: Secondary | ICD-10-CM | POA: Diagnosis not present

## 2019-08-09 DIAGNOSIS — R1310 Dysphagia, unspecified: Secondary | ICD-10-CM | POA: Diagnosis not present

## 2019-08-10 DIAGNOSIS — I10 Essential (primary) hypertension: Secondary | ICD-10-CM | POA: Diagnosis not present

## 2019-08-10 DIAGNOSIS — E43 Unspecified severe protein-calorie malnutrition: Secondary | ICD-10-CM | POA: Diagnosis not present

## 2019-08-10 DIAGNOSIS — R1310 Dysphagia, unspecified: Secondary | ICD-10-CM | POA: Diagnosis not present

## 2019-08-10 DIAGNOSIS — Z7401 Bed confinement status: Secondary | ICD-10-CM | POA: Diagnosis not present

## 2019-08-10 DIAGNOSIS — R443 Hallucinations, unspecified: Secondary | ICD-10-CM | POA: Diagnosis not present

## 2019-08-10 DIAGNOSIS — R1312 Dysphagia, oropharyngeal phase: Secondary | ICD-10-CM | POA: Diagnosis not present

## 2019-08-10 DIAGNOSIS — S01511D Laceration without foreign body of lip, subsequent encounter: Secondary | ICD-10-CM | POA: Diagnosis not present

## 2019-08-10 DIAGNOSIS — S81812D Laceration without foreign body, left lower leg, subsequent encounter: Secondary | ICD-10-CM | POA: Diagnosis not present

## 2019-08-10 DIAGNOSIS — R627 Adult failure to thrive: Secondary | ICD-10-CM | POA: Diagnosis not present

## 2019-08-13 DIAGNOSIS — E43 Unspecified severe protein-calorie malnutrition: Secondary | ICD-10-CM | POA: Diagnosis not present

## 2019-08-13 DIAGNOSIS — R443 Hallucinations, unspecified: Secondary | ICD-10-CM | POA: Diagnosis not present

## 2019-08-13 DIAGNOSIS — R1310 Dysphagia, unspecified: Secondary | ICD-10-CM | POA: Diagnosis not present

## 2019-08-13 DIAGNOSIS — S81812D Laceration without foreign body, left lower leg, subsequent encounter: Secondary | ICD-10-CM | POA: Diagnosis not present

## 2019-08-13 DIAGNOSIS — I10 Essential (primary) hypertension: Secondary | ICD-10-CM | POA: Diagnosis not present

## 2019-08-13 DIAGNOSIS — S01511D Laceration without foreign body of lip, subsequent encounter: Secondary | ICD-10-CM | POA: Diagnosis not present

## 2019-08-15 DIAGNOSIS — I10 Essential (primary) hypertension: Secondary | ICD-10-CM | POA: Diagnosis not present

## 2019-08-15 DIAGNOSIS — S81812D Laceration without foreign body, left lower leg, subsequent encounter: Secondary | ICD-10-CM | POA: Diagnosis not present

## 2019-08-15 DIAGNOSIS — R1310 Dysphagia, unspecified: Secondary | ICD-10-CM | POA: Diagnosis not present

## 2019-08-15 DIAGNOSIS — S01511D Laceration without foreign body of lip, subsequent encounter: Secondary | ICD-10-CM | POA: Diagnosis not present

## 2019-08-15 DIAGNOSIS — R443 Hallucinations, unspecified: Secondary | ICD-10-CM | POA: Diagnosis not present

## 2019-08-15 DIAGNOSIS — E43 Unspecified severe protein-calorie malnutrition: Secondary | ICD-10-CM | POA: Diagnosis not present

## 2019-08-16 DIAGNOSIS — I10 Essential (primary) hypertension: Secondary | ICD-10-CM | POA: Diagnosis not present

## 2019-08-16 DIAGNOSIS — R443 Hallucinations, unspecified: Secondary | ICD-10-CM | POA: Diagnosis not present

## 2019-08-16 DIAGNOSIS — E43 Unspecified severe protein-calorie malnutrition: Secondary | ICD-10-CM | POA: Diagnosis not present

## 2019-08-16 DIAGNOSIS — S01511D Laceration without foreign body of lip, subsequent encounter: Secondary | ICD-10-CM | POA: Diagnosis not present

## 2019-08-16 DIAGNOSIS — R1310 Dysphagia, unspecified: Secondary | ICD-10-CM | POA: Diagnosis not present

## 2019-08-16 DIAGNOSIS — S81812D Laceration without foreign body, left lower leg, subsequent encounter: Secondary | ICD-10-CM | POA: Diagnosis not present

## 2019-08-20 DIAGNOSIS — S01511D Laceration without foreign body of lip, subsequent encounter: Secondary | ICD-10-CM | POA: Diagnosis not present

## 2019-08-20 DIAGNOSIS — E43 Unspecified severe protein-calorie malnutrition: Secondary | ICD-10-CM | POA: Diagnosis not present

## 2019-08-20 DIAGNOSIS — R443 Hallucinations, unspecified: Secondary | ICD-10-CM | POA: Diagnosis not present

## 2019-08-20 DIAGNOSIS — R1310 Dysphagia, unspecified: Secondary | ICD-10-CM | POA: Diagnosis not present

## 2019-08-20 DIAGNOSIS — S81812D Laceration without foreign body, left lower leg, subsequent encounter: Secondary | ICD-10-CM | POA: Diagnosis not present

## 2019-08-20 DIAGNOSIS — I10 Essential (primary) hypertension: Secondary | ICD-10-CM | POA: Diagnosis not present

## 2019-08-22 DIAGNOSIS — E43 Unspecified severe protein-calorie malnutrition: Secondary | ICD-10-CM | POA: Diagnosis not present

## 2019-08-22 DIAGNOSIS — S01511D Laceration without foreign body of lip, subsequent encounter: Secondary | ICD-10-CM | POA: Diagnosis not present

## 2019-08-22 DIAGNOSIS — R443 Hallucinations, unspecified: Secondary | ICD-10-CM | POA: Diagnosis not present

## 2019-08-22 DIAGNOSIS — R1310 Dysphagia, unspecified: Secondary | ICD-10-CM | POA: Diagnosis not present

## 2019-08-22 DIAGNOSIS — I10 Essential (primary) hypertension: Secondary | ICD-10-CM | POA: Diagnosis not present

## 2019-08-22 DIAGNOSIS — S81812D Laceration without foreign body, left lower leg, subsequent encounter: Secondary | ICD-10-CM | POA: Diagnosis not present

## 2019-08-23 DIAGNOSIS — I10 Essential (primary) hypertension: Secondary | ICD-10-CM | POA: Diagnosis not present

## 2019-08-23 DIAGNOSIS — S81812D Laceration without foreign body, left lower leg, subsequent encounter: Secondary | ICD-10-CM | POA: Diagnosis not present

## 2019-08-23 DIAGNOSIS — S01511D Laceration without foreign body of lip, subsequent encounter: Secondary | ICD-10-CM | POA: Diagnosis not present

## 2019-08-23 DIAGNOSIS — R1310 Dysphagia, unspecified: Secondary | ICD-10-CM | POA: Diagnosis not present

## 2019-08-23 DIAGNOSIS — R443 Hallucinations, unspecified: Secondary | ICD-10-CM | POA: Diagnosis not present

## 2019-08-23 DIAGNOSIS — E43 Unspecified severe protein-calorie malnutrition: Secondary | ICD-10-CM | POA: Diagnosis not present

## 2019-08-26 DIAGNOSIS — Z9181 History of falling: Secondary | ICD-10-CM | POA: Diagnosis not present

## 2019-08-26 DIAGNOSIS — Z681 Body mass index (BMI) 19 or less, adult: Secondary | ICD-10-CM | POA: Diagnosis not present

## 2019-08-26 DIAGNOSIS — E039 Hypothyroidism, unspecified: Secondary | ICD-10-CM | POA: Diagnosis not present

## 2019-08-26 DIAGNOSIS — R443 Hallucinations, unspecified: Secondary | ICD-10-CM | POA: Diagnosis not present

## 2019-08-26 DIAGNOSIS — E43 Unspecified severe protein-calorie malnutrition: Secondary | ICD-10-CM | POA: Diagnosis not present

## 2019-08-26 DIAGNOSIS — I10 Essential (primary) hypertension: Secondary | ICD-10-CM | POA: Diagnosis not present

## 2019-08-26 DIAGNOSIS — Z741 Need for assistance with personal care: Secondary | ICD-10-CM | POA: Diagnosis not present

## 2019-08-26 DIAGNOSIS — R112 Nausea with vomiting, unspecified: Secondary | ICD-10-CM | POA: Diagnosis not present

## 2019-08-26 DIAGNOSIS — L89152 Pressure ulcer of sacral region, stage 2: Secondary | ICD-10-CM | POA: Diagnosis not present

## 2019-08-26 DIAGNOSIS — R1310 Dysphagia, unspecified: Secondary | ICD-10-CM | POA: Diagnosis not present

## 2019-08-27 DIAGNOSIS — I10 Essential (primary) hypertension: Secondary | ICD-10-CM | POA: Diagnosis not present

## 2019-08-27 DIAGNOSIS — E43 Unspecified severe protein-calorie malnutrition: Secondary | ICD-10-CM | POA: Diagnosis not present

## 2019-08-27 DIAGNOSIS — R443 Hallucinations, unspecified: Secondary | ICD-10-CM | POA: Diagnosis not present

## 2019-08-27 DIAGNOSIS — R112 Nausea with vomiting, unspecified: Secondary | ICD-10-CM | POA: Diagnosis not present

## 2019-08-27 DIAGNOSIS — L89152 Pressure ulcer of sacral region, stage 2: Secondary | ICD-10-CM | POA: Diagnosis not present

## 2019-08-27 DIAGNOSIS — R1310 Dysphagia, unspecified: Secondary | ICD-10-CM | POA: Diagnosis not present

## 2019-08-29 DIAGNOSIS — L89152 Pressure ulcer of sacral region, stage 2: Secondary | ICD-10-CM | POA: Diagnosis not present

## 2019-08-29 DIAGNOSIS — R443 Hallucinations, unspecified: Secondary | ICD-10-CM | POA: Diagnosis not present

## 2019-08-29 DIAGNOSIS — I10 Essential (primary) hypertension: Secondary | ICD-10-CM | POA: Diagnosis not present

## 2019-08-29 DIAGNOSIS — E43 Unspecified severe protein-calorie malnutrition: Secondary | ICD-10-CM | POA: Diagnosis not present

## 2019-08-29 DIAGNOSIS — R1310 Dysphagia, unspecified: Secondary | ICD-10-CM | POA: Diagnosis not present

## 2019-08-29 DIAGNOSIS — R112 Nausea with vomiting, unspecified: Secondary | ICD-10-CM | POA: Diagnosis not present

## 2019-08-30 DIAGNOSIS — R1310 Dysphagia, unspecified: Secondary | ICD-10-CM | POA: Diagnosis not present

## 2019-08-30 DIAGNOSIS — E43 Unspecified severe protein-calorie malnutrition: Secondary | ICD-10-CM | POA: Diagnosis not present

## 2019-08-30 DIAGNOSIS — R443 Hallucinations, unspecified: Secondary | ICD-10-CM | POA: Diagnosis not present

## 2019-08-30 DIAGNOSIS — R112 Nausea with vomiting, unspecified: Secondary | ICD-10-CM | POA: Diagnosis not present

## 2019-08-30 DIAGNOSIS — L89152 Pressure ulcer of sacral region, stage 2: Secondary | ICD-10-CM | POA: Diagnosis not present

## 2019-08-30 DIAGNOSIS — I10 Essential (primary) hypertension: Secondary | ICD-10-CM | POA: Diagnosis not present

## 2019-09-03 DIAGNOSIS — I10 Essential (primary) hypertension: Secondary | ICD-10-CM | POA: Diagnosis not present

## 2019-09-03 DIAGNOSIS — R112 Nausea with vomiting, unspecified: Secondary | ICD-10-CM | POA: Diagnosis not present

## 2019-09-03 DIAGNOSIS — L89152 Pressure ulcer of sacral region, stage 2: Secondary | ICD-10-CM | POA: Diagnosis not present

## 2019-09-03 DIAGNOSIS — E43 Unspecified severe protein-calorie malnutrition: Secondary | ICD-10-CM | POA: Diagnosis not present

## 2019-09-03 DIAGNOSIS — R443 Hallucinations, unspecified: Secondary | ICD-10-CM | POA: Diagnosis not present

## 2019-09-03 DIAGNOSIS — R1310 Dysphagia, unspecified: Secondary | ICD-10-CM | POA: Diagnosis not present

## 2019-09-04 DIAGNOSIS — I10 Essential (primary) hypertension: Secondary | ICD-10-CM | POA: Diagnosis not present

## 2019-09-04 DIAGNOSIS — E43 Unspecified severe protein-calorie malnutrition: Secondary | ICD-10-CM | POA: Diagnosis not present

## 2019-09-04 DIAGNOSIS — R1310 Dysphagia, unspecified: Secondary | ICD-10-CM | POA: Diagnosis not present

## 2019-09-04 DIAGNOSIS — R112 Nausea with vomiting, unspecified: Secondary | ICD-10-CM | POA: Diagnosis not present

## 2019-09-04 DIAGNOSIS — L89152 Pressure ulcer of sacral region, stage 2: Secondary | ICD-10-CM | POA: Diagnosis not present

## 2019-09-04 DIAGNOSIS — R443 Hallucinations, unspecified: Secondary | ICD-10-CM | POA: Diagnosis not present

## 2019-09-05 DIAGNOSIS — R443 Hallucinations, unspecified: Secondary | ICD-10-CM | POA: Diagnosis not present

## 2019-09-05 DIAGNOSIS — R112 Nausea with vomiting, unspecified: Secondary | ICD-10-CM | POA: Diagnosis not present

## 2019-09-05 DIAGNOSIS — E43 Unspecified severe protein-calorie malnutrition: Secondary | ICD-10-CM | POA: Diagnosis not present

## 2019-09-05 DIAGNOSIS — L89152 Pressure ulcer of sacral region, stage 2: Secondary | ICD-10-CM | POA: Diagnosis not present

## 2019-09-05 DIAGNOSIS — R1310 Dysphagia, unspecified: Secondary | ICD-10-CM | POA: Diagnosis not present

## 2019-09-05 DIAGNOSIS — I10 Essential (primary) hypertension: Secondary | ICD-10-CM | POA: Diagnosis not present

## 2019-09-06 DIAGNOSIS — R112 Nausea with vomiting, unspecified: Secondary | ICD-10-CM | POA: Diagnosis not present

## 2019-09-06 DIAGNOSIS — R443 Hallucinations, unspecified: Secondary | ICD-10-CM | POA: Diagnosis not present

## 2019-09-06 DIAGNOSIS — I10 Essential (primary) hypertension: Secondary | ICD-10-CM | POA: Diagnosis not present

## 2019-09-06 DIAGNOSIS — E43 Unspecified severe protein-calorie malnutrition: Secondary | ICD-10-CM | POA: Diagnosis not present

## 2019-09-06 DIAGNOSIS — L89152 Pressure ulcer of sacral region, stage 2: Secondary | ICD-10-CM | POA: Diagnosis not present

## 2019-09-06 DIAGNOSIS — R1310 Dysphagia, unspecified: Secondary | ICD-10-CM | POA: Diagnosis not present

## 2019-09-10 DIAGNOSIS — I10 Essential (primary) hypertension: Secondary | ICD-10-CM | POA: Diagnosis not present

## 2019-09-10 DIAGNOSIS — R443 Hallucinations, unspecified: Secondary | ICD-10-CM | POA: Diagnosis not present

## 2019-09-10 DIAGNOSIS — R1310 Dysphagia, unspecified: Secondary | ICD-10-CM | POA: Diagnosis not present

## 2019-09-10 DIAGNOSIS — R112 Nausea with vomiting, unspecified: Secondary | ICD-10-CM | POA: Diagnosis not present

## 2019-09-10 DIAGNOSIS — L89152 Pressure ulcer of sacral region, stage 2: Secondary | ICD-10-CM | POA: Diagnosis not present

## 2019-09-10 DIAGNOSIS — E43 Unspecified severe protein-calorie malnutrition: Secondary | ICD-10-CM | POA: Diagnosis not present

## 2019-09-12 DIAGNOSIS — R443 Hallucinations, unspecified: Secondary | ICD-10-CM | POA: Diagnosis not present

## 2019-09-12 DIAGNOSIS — R112 Nausea with vomiting, unspecified: Secondary | ICD-10-CM | POA: Diagnosis not present

## 2019-09-12 DIAGNOSIS — I10 Essential (primary) hypertension: Secondary | ICD-10-CM | POA: Diagnosis not present

## 2019-09-12 DIAGNOSIS — L89152 Pressure ulcer of sacral region, stage 2: Secondary | ICD-10-CM | POA: Diagnosis not present

## 2019-09-12 DIAGNOSIS — E43 Unspecified severe protein-calorie malnutrition: Secondary | ICD-10-CM | POA: Diagnosis not present

## 2019-09-12 DIAGNOSIS — R1310 Dysphagia, unspecified: Secondary | ICD-10-CM | POA: Diagnosis not present

## 2019-09-13 DIAGNOSIS — L89152 Pressure ulcer of sacral region, stage 2: Secondary | ICD-10-CM | POA: Diagnosis not present

## 2019-09-13 DIAGNOSIS — R443 Hallucinations, unspecified: Secondary | ICD-10-CM | POA: Diagnosis not present

## 2019-09-13 DIAGNOSIS — E43 Unspecified severe protein-calorie malnutrition: Secondary | ICD-10-CM | POA: Diagnosis not present

## 2019-09-13 DIAGNOSIS — R1310 Dysphagia, unspecified: Secondary | ICD-10-CM | POA: Diagnosis not present

## 2019-09-13 DIAGNOSIS — I10 Essential (primary) hypertension: Secondary | ICD-10-CM | POA: Diagnosis not present

## 2019-09-13 DIAGNOSIS — R112 Nausea with vomiting, unspecified: Secondary | ICD-10-CM | POA: Diagnosis not present

## 2019-09-17 DIAGNOSIS — R112 Nausea with vomiting, unspecified: Secondary | ICD-10-CM | POA: Diagnosis not present

## 2019-09-17 DIAGNOSIS — R1310 Dysphagia, unspecified: Secondary | ICD-10-CM | POA: Diagnosis not present

## 2019-09-17 DIAGNOSIS — E43 Unspecified severe protein-calorie malnutrition: Secondary | ICD-10-CM | POA: Diagnosis not present

## 2019-09-17 DIAGNOSIS — R443 Hallucinations, unspecified: Secondary | ICD-10-CM | POA: Diagnosis not present

## 2019-09-17 DIAGNOSIS — I10 Essential (primary) hypertension: Secondary | ICD-10-CM | POA: Diagnosis not present

## 2019-09-17 DIAGNOSIS — L89152 Pressure ulcer of sacral region, stage 2: Secondary | ICD-10-CM | POA: Diagnosis not present

## 2019-09-19 DIAGNOSIS — R1310 Dysphagia, unspecified: Secondary | ICD-10-CM | POA: Diagnosis not present

## 2019-09-19 DIAGNOSIS — E43 Unspecified severe protein-calorie malnutrition: Secondary | ICD-10-CM | POA: Diagnosis not present

## 2019-09-19 DIAGNOSIS — I10 Essential (primary) hypertension: Secondary | ICD-10-CM | POA: Diagnosis not present

## 2019-09-19 DIAGNOSIS — R443 Hallucinations, unspecified: Secondary | ICD-10-CM | POA: Diagnosis not present

## 2019-09-19 DIAGNOSIS — R112 Nausea with vomiting, unspecified: Secondary | ICD-10-CM | POA: Diagnosis not present

## 2019-09-19 DIAGNOSIS — L89152 Pressure ulcer of sacral region, stage 2: Secondary | ICD-10-CM | POA: Diagnosis not present

## 2019-09-20 DIAGNOSIS — R443 Hallucinations, unspecified: Secondary | ICD-10-CM | POA: Diagnosis not present

## 2019-09-20 DIAGNOSIS — L89152 Pressure ulcer of sacral region, stage 2: Secondary | ICD-10-CM | POA: Diagnosis not present

## 2019-09-20 DIAGNOSIS — E43 Unspecified severe protein-calorie malnutrition: Secondary | ICD-10-CM | POA: Diagnosis not present

## 2019-09-20 DIAGNOSIS — R1310 Dysphagia, unspecified: Secondary | ICD-10-CM | POA: Diagnosis not present

## 2019-09-20 DIAGNOSIS — R112 Nausea with vomiting, unspecified: Secondary | ICD-10-CM | POA: Diagnosis not present

## 2019-09-20 DIAGNOSIS — I10 Essential (primary) hypertension: Secondary | ICD-10-CM | POA: Diagnosis not present

## 2019-09-24 DIAGNOSIS — I10 Essential (primary) hypertension: Secondary | ICD-10-CM | POA: Diagnosis not present

## 2019-09-24 DIAGNOSIS — E43 Unspecified severe protein-calorie malnutrition: Secondary | ICD-10-CM | POA: Diagnosis not present

## 2019-09-24 DIAGNOSIS — R1310 Dysphagia, unspecified: Secondary | ICD-10-CM | POA: Diagnosis not present

## 2019-09-24 DIAGNOSIS — L89152 Pressure ulcer of sacral region, stage 2: Secondary | ICD-10-CM | POA: Diagnosis not present

## 2019-09-24 DIAGNOSIS — R443 Hallucinations, unspecified: Secondary | ICD-10-CM | POA: Diagnosis not present

## 2019-09-24 DIAGNOSIS — R112 Nausea with vomiting, unspecified: Secondary | ICD-10-CM | POA: Diagnosis not present

## 2019-09-26 DIAGNOSIS — Z681 Body mass index (BMI) 19 or less, adult: Secondary | ICD-10-CM | POA: Diagnosis not present

## 2019-09-26 DIAGNOSIS — R443 Hallucinations, unspecified: Secondary | ICD-10-CM | POA: Diagnosis not present

## 2019-09-26 DIAGNOSIS — E43 Unspecified severe protein-calorie malnutrition: Secondary | ICD-10-CM | POA: Diagnosis not present

## 2019-09-26 DIAGNOSIS — Z741 Need for assistance with personal care: Secondary | ICD-10-CM | POA: Diagnosis not present

## 2019-09-26 DIAGNOSIS — R1310 Dysphagia, unspecified: Secondary | ICD-10-CM | POA: Diagnosis not present

## 2019-09-26 DIAGNOSIS — Z9181 History of falling: Secondary | ICD-10-CM | POA: Diagnosis not present

## 2019-09-26 DIAGNOSIS — L89152 Pressure ulcer of sacral region, stage 2: Secondary | ICD-10-CM | POA: Diagnosis not present

## 2019-09-26 DIAGNOSIS — I10 Essential (primary) hypertension: Secondary | ICD-10-CM | POA: Diagnosis not present

## 2019-09-26 DIAGNOSIS — E039 Hypothyroidism, unspecified: Secondary | ICD-10-CM | POA: Diagnosis not present

## 2019-09-26 DIAGNOSIS — R112 Nausea with vomiting, unspecified: Secondary | ICD-10-CM | POA: Diagnosis not present

## 2019-09-27 DIAGNOSIS — I10 Essential (primary) hypertension: Secondary | ICD-10-CM | POA: Diagnosis not present

## 2019-09-27 DIAGNOSIS — R1310 Dysphagia, unspecified: Secondary | ICD-10-CM | POA: Diagnosis not present

## 2019-09-27 DIAGNOSIS — L89152 Pressure ulcer of sacral region, stage 2: Secondary | ICD-10-CM | POA: Diagnosis not present

## 2019-09-27 DIAGNOSIS — R112 Nausea with vomiting, unspecified: Secondary | ICD-10-CM | POA: Diagnosis not present

## 2019-09-27 DIAGNOSIS — R443 Hallucinations, unspecified: Secondary | ICD-10-CM | POA: Diagnosis not present

## 2019-09-27 DIAGNOSIS — E43 Unspecified severe protein-calorie malnutrition: Secondary | ICD-10-CM | POA: Diagnosis not present

## 2019-10-01 DIAGNOSIS — I10 Essential (primary) hypertension: Secondary | ICD-10-CM | POA: Diagnosis not present

## 2019-10-01 DIAGNOSIS — E43 Unspecified severe protein-calorie malnutrition: Secondary | ICD-10-CM | POA: Diagnosis not present

## 2019-10-01 DIAGNOSIS — R112 Nausea with vomiting, unspecified: Secondary | ICD-10-CM | POA: Diagnosis not present

## 2019-10-01 DIAGNOSIS — R1310 Dysphagia, unspecified: Secondary | ICD-10-CM | POA: Diagnosis not present

## 2019-10-01 DIAGNOSIS — R443 Hallucinations, unspecified: Secondary | ICD-10-CM | POA: Diagnosis not present

## 2019-10-01 DIAGNOSIS — L89152 Pressure ulcer of sacral region, stage 2: Secondary | ICD-10-CM | POA: Diagnosis not present

## 2019-10-03 DIAGNOSIS — R443 Hallucinations, unspecified: Secondary | ICD-10-CM | POA: Diagnosis not present

## 2019-10-03 DIAGNOSIS — R112 Nausea with vomiting, unspecified: Secondary | ICD-10-CM | POA: Diagnosis not present

## 2019-10-03 DIAGNOSIS — I10 Essential (primary) hypertension: Secondary | ICD-10-CM | POA: Diagnosis not present

## 2019-10-03 DIAGNOSIS — E43 Unspecified severe protein-calorie malnutrition: Secondary | ICD-10-CM | POA: Diagnosis not present

## 2019-10-03 DIAGNOSIS — R1310 Dysphagia, unspecified: Secondary | ICD-10-CM | POA: Diagnosis not present

## 2019-10-03 DIAGNOSIS — L89152 Pressure ulcer of sacral region, stage 2: Secondary | ICD-10-CM | POA: Diagnosis not present

## 2019-10-04 DIAGNOSIS — R443 Hallucinations, unspecified: Secondary | ICD-10-CM | POA: Diagnosis not present

## 2019-10-04 DIAGNOSIS — I10 Essential (primary) hypertension: Secondary | ICD-10-CM | POA: Diagnosis not present

## 2019-10-04 DIAGNOSIS — L89152 Pressure ulcer of sacral region, stage 2: Secondary | ICD-10-CM | POA: Diagnosis not present

## 2019-10-04 DIAGNOSIS — R1310 Dysphagia, unspecified: Secondary | ICD-10-CM | POA: Diagnosis not present

## 2019-10-04 DIAGNOSIS — E43 Unspecified severe protein-calorie malnutrition: Secondary | ICD-10-CM | POA: Diagnosis not present

## 2019-10-04 DIAGNOSIS — R112 Nausea with vomiting, unspecified: Secondary | ICD-10-CM | POA: Diagnosis not present

## 2019-10-08 DIAGNOSIS — E43 Unspecified severe protein-calorie malnutrition: Secondary | ICD-10-CM | POA: Diagnosis not present

## 2019-10-08 DIAGNOSIS — R1310 Dysphagia, unspecified: Secondary | ICD-10-CM | POA: Diagnosis not present

## 2019-10-08 DIAGNOSIS — R112 Nausea with vomiting, unspecified: Secondary | ICD-10-CM | POA: Diagnosis not present

## 2019-10-08 DIAGNOSIS — L89152 Pressure ulcer of sacral region, stage 2: Secondary | ICD-10-CM | POA: Diagnosis not present

## 2019-10-08 DIAGNOSIS — I10 Essential (primary) hypertension: Secondary | ICD-10-CM | POA: Diagnosis not present

## 2019-10-08 DIAGNOSIS — R443 Hallucinations, unspecified: Secondary | ICD-10-CM | POA: Diagnosis not present

## 2019-10-10 DIAGNOSIS — I10 Essential (primary) hypertension: Secondary | ICD-10-CM | POA: Diagnosis not present

## 2019-10-10 DIAGNOSIS — L89152 Pressure ulcer of sacral region, stage 2: Secondary | ICD-10-CM | POA: Diagnosis not present

## 2019-10-10 DIAGNOSIS — R1310 Dysphagia, unspecified: Secondary | ICD-10-CM | POA: Diagnosis not present

## 2019-10-10 DIAGNOSIS — E43 Unspecified severe protein-calorie malnutrition: Secondary | ICD-10-CM | POA: Diagnosis not present

## 2019-10-10 DIAGNOSIS — R112 Nausea with vomiting, unspecified: Secondary | ICD-10-CM | POA: Diagnosis not present

## 2019-10-10 DIAGNOSIS — R443 Hallucinations, unspecified: Secondary | ICD-10-CM | POA: Diagnosis not present

## 2019-10-11 DIAGNOSIS — R112 Nausea with vomiting, unspecified: Secondary | ICD-10-CM | POA: Diagnosis not present

## 2019-10-11 DIAGNOSIS — R443 Hallucinations, unspecified: Secondary | ICD-10-CM | POA: Diagnosis not present

## 2019-10-11 DIAGNOSIS — I10 Essential (primary) hypertension: Secondary | ICD-10-CM | POA: Diagnosis not present

## 2019-10-11 DIAGNOSIS — R1310 Dysphagia, unspecified: Secondary | ICD-10-CM | POA: Diagnosis not present

## 2019-10-11 DIAGNOSIS — L89152 Pressure ulcer of sacral region, stage 2: Secondary | ICD-10-CM | POA: Diagnosis not present

## 2019-10-11 DIAGNOSIS — E43 Unspecified severe protein-calorie malnutrition: Secondary | ICD-10-CM | POA: Diagnosis not present

## 2019-10-15 DIAGNOSIS — I10 Essential (primary) hypertension: Secondary | ICD-10-CM | POA: Diagnosis not present

## 2019-10-15 DIAGNOSIS — E43 Unspecified severe protein-calorie malnutrition: Secondary | ICD-10-CM | POA: Diagnosis not present

## 2019-10-15 DIAGNOSIS — R112 Nausea with vomiting, unspecified: Secondary | ICD-10-CM | POA: Diagnosis not present

## 2019-10-15 DIAGNOSIS — R1310 Dysphagia, unspecified: Secondary | ICD-10-CM | POA: Diagnosis not present

## 2019-10-15 DIAGNOSIS — L89152 Pressure ulcer of sacral region, stage 2: Secondary | ICD-10-CM | POA: Diagnosis not present

## 2019-10-15 DIAGNOSIS — R443 Hallucinations, unspecified: Secondary | ICD-10-CM | POA: Diagnosis not present

## 2019-10-17 DIAGNOSIS — E43 Unspecified severe protein-calorie malnutrition: Secondary | ICD-10-CM | POA: Diagnosis not present

## 2019-10-17 DIAGNOSIS — L89152 Pressure ulcer of sacral region, stage 2: Secondary | ICD-10-CM | POA: Diagnosis not present

## 2019-10-17 DIAGNOSIS — R112 Nausea with vomiting, unspecified: Secondary | ICD-10-CM | POA: Diagnosis not present

## 2019-10-17 DIAGNOSIS — R1310 Dysphagia, unspecified: Secondary | ICD-10-CM | POA: Diagnosis not present

## 2019-10-17 DIAGNOSIS — R443 Hallucinations, unspecified: Secondary | ICD-10-CM | POA: Diagnosis not present

## 2019-10-17 DIAGNOSIS — I10 Essential (primary) hypertension: Secondary | ICD-10-CM | POA: Diagnosis not present

## 2019-10-18 DIAGNOSIS — L89152 Pressure ulcer of sacral region, stage 2: Secondary | ICD-10-CM | POA: Diagnosis not present

## 2019-10-18 DIAGNOSIS — R112 Nausea with vomiting, unspecified: Secondary | ICD-10-CM | POA: Diagnosis not present

## 2019-10-18 DIAGNOSIS — R1310 Dysphagia, unspecified: Secondary | ICD-10-CM | POA: Diagnosis not present

## 2019-10-18 DIAGNOSIS — E43 Unspecified severe protein-calorie malnutrition: Secondary | ICD-10-CM | POA: Diagnosis not present

## 2019-10-18 DIAGNOSIS — R443 Hallucinations, unspecified: Secondary | ICD-10-CM | POA: Diagnosis not present

## 2019-10-18 DIAGNOSIS — I10 Essential (primary) hypertension: Secondary | ICD-10-CM | POA: Diagnosis not present

## 2019-10-22 DIAGNOSIS — R443 Hallucinations, unspecified: Secondary | ICD-10-CM | POA: Diagnosis not present

## 2019-10-22 DIAGNOSIS — E43 Unspecified severe protein-calorie malnutrition: Secondary | ICD-10-CM | POA: Diagnosis not present

## 2019-10-22 DIAGNOSIS — L89152 Pressure ulcer of sacral region, stage 2: Secondary | ICD-10-CM | POA: Diagnosis not present

## 2019-10-22 DIAGNOSIS — R112 Nausea with vomiting, unspecified: Secondary | ICD-10-CM | POA: Diagnosis not present

## 2019-10-22 DIAGNOSIS — R1310 Dysphagia, unspecified: Secondary | ICD-10-CM | POA: Diagnosis not present

## 2019-10-22 DIAGNOSIS — I10 Essential (primary) hypertension: Secondary | ICD-10-CM | POA: Diagnosis not present

## 2019-10-24 DIAGNOSIS — R1310 Dysphagia, unspecified: Secondary | ICD-10-CM | POA: Diagnosis not present

## 2019-10-24 DIAGNOSIS — I10 Essential (primary) hypertension: Secondary | ICD-10-CM | POA: Diagnosis not present

## 2019-10-24 DIAGNOSIS — R443 Hallucinations, unspecified: Secondary | ICD-10-CM | POA: Diagnosis not present

## 2019-10-24 DIAGNOSIS — E43 Unspecified severe protein-calorie malnutrition: Secondary | ICD-10-CM | POA: Diagnosis not present

## 2019-10-24 DIAGNOSIS — R112 Nausea with vomiting, unspecified: Secondary | ICD-10-CM | POA: Diagnosis not present

## 2019-10-24 DIAGNOSIS — L89152 Pressure ulcer of sacral region, stage 2: Secondary | ICD-10-CM | POA: Diagnosis not present

## 2019-10-25 DIAGNOSIS — R443 Hallucinations, unspecified: Secondary | ICD-10-CM | POA: Diagnosis not present

## 2019-10-25 DIAGNOSIS — R112 Nausea with vomiting, unspecified: Secondary | ICD-10-CM | POA: Diagnosis not present

## 2019-10-25 DIAGNOSIS — R1310 Dysphagia, unspecified: Secondary | ICD-10-CM | POA: Diagnosis not present

## 2019-10-25 DIAGNOSIS — E43 Unspecified severe protein-calorie malnutrition: Secondary | ICD-10-CM | POA: Diagnosis not present

## 2019-10-25 DIAGNOSIS — L89152 Pressure ulcer of sacral region, stage 2: Secondary | ICD-10-CM | POA: Diagnosis not present

## 2019-10-25 DIAGNOSIS — I10 Essential (primary) hypertension: Secondary | ICD-10-CM | POA: Diagnosis not present

## 2019-10-26 DIAGNOSIS — Z681 Body mass index (BMI) 19 or less, adult: Secondary | ICD-10-CM | POA: Diagnosis not present

## 2019-10-26 DIAGNOSIS — E43 Unspecified severe protein-calorie malnutrition: Secondary | ICD-10-CM | POA: Diagnosis not present

## 2019-10-26 DIAGNOSIS — Z741 Need for assistance with personal care: Secondary | ICD-10-CM | POA: Diagnosis not present

## 2019-10-26 DIAGNOSIS — R1310 Dysphagia, unspecified: Secondary | ICD-10-CM | POA: Diagnosis not present

## 2019-10-26 DIAGNOSIS — L89152 Pressure ulcer of sacral region, stage 2: Secondary | ICD-10-CM | POA: Diagnosis not present

## 2019-10-26 DIAGNOSIS — I10 Essential (primary) hypertension: Secondary | ICD-10-CM | POA: Diagnosis not present

## 2019-10-26 DIAGNOSIS — R112 Nausea with vomiting, unspecified: Secondary | ICD-10-CM | POA: Diagnosis not present

## 2019-10-26 DIAGNOSIS — Z9181 History of falling: Secondary | ICD-10-CM | POA: Diagnosis not present

## 2019-10-26 DIAGNOSIS — E039 Hypothyroidism, unspecified: Secondary | ICD-10-CM | POA: Diagnosis not present

## 2019-10-26 DIAGNOSIS — R443 Hallucinations, unspecified: Secondary | ICD-10-CM | POA: Diagnosis not present

## 2019-10-29 DIAGNOSIS — R112 Nausea with vomiting, unspecified: Secondary | ICD-10-CM | POA: Diagnosis not present

## 2019-10-29 DIAGNOSIS — E43 Unspecified severe protein-calorie malnutrition: Secondary | ICD-10-CM | POA: Diagnosis not present

## 2019-10-29 DIAGNOSIS — L89152 Pressure ulcer of sacral region, stage 2: Secondary | ICD-10-CM | POA: Diagnosis not present

## 2019-10-29 DIAGNOSIS — R443 Hallucinations, unspecified: Secondary | ICD-10-CM | POA: Diagnosis not present

## 2019-10-29 DIAGNOSIS — I10 Essential (primary) hypertension: Secondary | ICD-10-CM | POA: Diagnosis not present

## 2019-10-29 DIAGNOSIS — R1310 Dysphagia, unspecified: Secondary | ICD-10-CM | POA: Diagnosis not present

## 2019-10-31 DIAGNOSIS — I10 Essential (primary) hypertension: Secondary | ICD-10-CM | POA: Diagnosis not present

## 2019-10-31 DIAGNOSIS — R443 Hallucinations, unspecified: Secondary | ICD-10-CM | POA: Diagnosis not present

## 2019-10-31 DIAGNOSIS — E43 Unspecified severe protein-calorie malnutrition: Secondary | ICD-10-CM | POA: Diagnosis not present

## 2019-10-31 DIAGNOSIS — L89152 Pressure ulcer of sacral region, stage 2: Secondary | ICD-10-CM | POA: Diagnosis not present

## 2019-10-31 DIAGNOSIS — R112 Nausea with vomiting, unspecified: Secondary | ICD-10-CM | POA: Diagnosis not present

## 2019-10-31 DIAGNOSIS — R1310 Dysphagia, unspecified: Secondary | ICD-10-CM | POA: Diagnosis not present

## 2019-11-01 DIAGNOSIS — R1310 Dysphagia, unspecified: Secondary | ICD-10-CM | POA: Diagnosis not present

## 2019-11-01 DIAGNOSIS — L89152 Pressure ulcer of sacral region, stage 2: Secondary | ICD-10-CM | POA: Diagnosis not present

## 2019-11-01 DIAGNOSIS — E43 Unspecified severe protein-calorie malnutrition: Secondary | ICD-10-CM | POA: Diagnosis not present

## 2019-11-01 DIAGNOSIS — R112 Nausea with vomiting, unspecified: Secondary | ICD-10-CM | POA: Diagnosis not present

## 2019-11-01 DIAGNOSIS — I10 Essential (primary) hypertension: Secondary | ICD-10-CM | POA: Diagnosis not present

## 2019-11-01 DIAGNOSIS — R443 Hallucinations, unspecified: Secondary | ICD-10-CM | POA: Diagnosis not present

## 2019-11-05 DIAGNOSIS — Z7401 Bed confinement status: Secondary | ICD-10-CM | POA: Diagnosis not present

## 2019-11-05 DIAGNOSIS — E43 Unspecified severe protein-calorie malnutrition: Secondary | ICD-10-CM | POA: Diagnosis not present

## 2019-11-05 DIAGNOSIS — I10 Essential (primary) hypertension: Secondary | ICD-10-CM | POA: Diagnosis not present

## 2019-11-05 DIAGNOSIS — K5900 Constipation, unspecified: Secondary | ICD-10-CM | POA: Diagnosis not present

## 2019-11-05 DIAGNOSIS — R1310 Dysphagia, unspecified: Secondary | ICD-10-CM | POA: Diagnosis not present

## 2019-11-05 DIAGNOSIS — R443 Hallucinations, unspecified: Secondary | ICD-10-CM | POA: Diagnosis not present

## 2019-11-05 DIAGNOSIS — L89152 Pressure ulcer of sacral region, stage 2: Secondary | ICD-10-CM | POA: Diagnosis not present

## 2019-11-05 DIAGNOSIS — R112 Nausea with vomiting, unspecified: Secondary | ICD-10-CM | POA: Diagnosis not present

## 2019-11-05 DIAGNOSIS — R627 Adult failure to thrive: Secondary | ICD-10-CM | POA: Diagnosis not present

## 2019-11-07 DIAGNOSIS — L89152 Pressure ulcer of sacral region, stage 2: Secondary | ICD-10-CM | POA: Diagnosis not present

## 2019-11-07 DIAGNOSIS — R1310 Dysphagia, unspecified: Secondary | ICD-10-CM | POA: Diagnosis not present

## 2019-11-07 DIAGNOSIS — R112 Nausea with vomiting, unspecified: Secondary | ICD-10-CM | POA: Diagnosis not present

## 2019-11-07 DIAGNOSIS — I10 Essential (primary) hypertension: Secondary | ICD-10-CM | POA: Diagnosis not present

## 2019-11-07 DIAGNOSIS — E43 Unspecified severe protein-calorie malnutrition: Secondary | ICD-10-CM | POA: Diagnosis not present

## 2019-11-07 DIAGNOSIS — R443 Hallucinations, unspecified: Secondary | ICD-10-CM | POA: Diagnosis not present

## 2019-11-08 DIAGNOSIS — E43 Unspecified severe protein-calorie malnutrition: Secondary | ICD-10-CM | POA: Diagnosis not present

## 2019-11-08 DIAGNOSIS — L89152 Pressure ulcer of sacral region, stage 2: Secondary | ICD-10-CM | POA: Diagnosis not present

## 2019-11-08 DIAGNOSIS — R112 Nausea with vomiting, unspecified: Secondary | ICD-10-CM | POA: Diagnosis not present

## 2019-11-08 DIAGNOSIS — R443 Hallucinations, unspecified: Secondary | ICD-10-CM | POA: Diagnosis not present

## 2019-11-08 DIAGNOSIS — I10 Essential (primary) hypertension: Secondary | ICD-10-CM | POA: Diagnosis not present

## 2019-11-08 DIAGNOSIS — R1310 Dysphagia, unspecified: Secondary | ICD-10-CM | POA: Diagnosis not present

## 2019-11-12 DIAGNOSIS — L89152 Pressure ulcer of sacral region, stage 2: Secondary | ICD-10-CM | POA: Diagnosis not present

## 2019-11-12 DIAGNOSIS — E43 Unspecified severe protein-calorie malnutrition: Secondary | ICD-10-CM | POA: Diagnosis not present

## 2019-11-12 DIAGNOSIS — I10 Essential (primary) hypertension: Secondary | ICD-10-CM | POA: Diagnosis not present

## 2019-11-12 DIAGNOSIS — R1310 Dysphagia, unspecified: Secondary | ICD-10-CM | POA: Diagnosis not present

## 2019-11-12 DIAGNOSIS — R112 Nausea with vomiting, unspecified: Secondary | ICD-10-CM | POA: Diagnosis not present

## 2019-11-12 DIAGNOSIS — R443 Hallucinations, unspecified: Secondary | ICD-10-CM | POA: Diagnosis not present

## 2019-11-14 DIAGNOSIS — I10 Essential (primary) hypertension: Secondary | ICD-10-CM | POA: Diagnosis not present

## 2019-11-14 DIAGNOSIS — R112 Nausea with vomiting, unspecified: Secondary | ICD-10-CM | POA: Diagnosis not present

## 2019-11-14 DIAGNOSIS — R443 Hallucinations, unspecified: Secondary | ICD-10-CM | POA: Diagnosis not present

## 2019-11-14 DIAGNOSIS — L89152 Pressure ulcer of sacral region, stage 2: Secondary | ICD-10-CM | POA: Diagnosis not present

## 2019-11-14 DIAGNOSIS — E43 Unspecified severe protein-calorie malnutrition: Secondary | ICD-10-CM | POA: Diagnosis not present

## 2019-11-14 DIAGNOSIS — R1310 Dysphagia, unspecified: Secondary | ICD-10-CM | POA: Diagnosis not present

## 2019-11-15 DIAGNOSIS — I10 Essential (primary) hypertension: Secondary | ICD-10-CM | POA: Diagnosis not present

## 2019-11-15 DIAGNOSIS — R112 Nausea with vomiting, unspecified: Secondary | ICD-10-CM | POA: Diagnosis not present

## 2019-11-15 DIAGNOSIS — R443 Hallucinations, unspecified: Secondary | ICD-10-CM | POA: Diagnosis not present

## 2019-11-15 DIAGNOSIS — E43 Unspecified severe protein-calorie malnutrition: Secondary | ICD-10-CM | POA: Diagnosis not present

## 2019-11-15 DIAGNOSIS — R1310 Dysphagia, unspecified: Secondary | ICD-10-CM | POA: Diagnosis not present

## 2019-11-15 DIAGNOSIS — L89152 Pressure ulcer of sacral region, stage 2: Secondary | ICD-10-CM | POA: Diagnosis not present

## 2019-11-19 DIAGNOSIS — R112 Nausea with vomiting, unspecified: Secondary | ICD-10-CM | POA: Diagnosis not present

## 2019-11-19 DIAGNOSIS — I10 Essential (primary) hypertension: Secondary | ICD-10-CM | POA: Diagnosis not present

## 2019-11-19 DIAGNOSIS — R1310 Dysphagia, unspecified: Secondary | ICD-10-CM | POA: Diagnosis not present

## 2019-11-19 DIAGNOSIS — E43 Unspecified severe protein-calorie malnutrition: Secondary | ICD-10-CM | POA: Diagnosis not present

## 2019-11-19 DIAGNOSIS — L89152 Pressure ulcer of sacral region, stage 2: Secondary | ICD-10-CM | POA: Diagnosis not present

## 2019-11-19 DIAGNOSIS — R443 Hallucinations, unspecified: Secondary | ICD-10-CM | POA: Diagnosis not present

## 2019-11-21 DIAGNOSIS — L89152 Pressure ulcer of sacral region, stage 2: Secondary | ICD-10-CM | POA: Diagnosis not present

## 2019-11-21 DIAGNOSIS — I10 Essential (primary) hypertension: Secondary | ICD-10-CM | POA: Diagnosis not present

## 2019-11-21 DIAGNOSIS — E43 Unspecified severe protein-calorie malnutrition: Secondary | ICD-10-CM | POA: Diagnosis not present

## 2019-11-21 DIAGNOSIS — R443 Hallucinations, unspecified: Secondary | ICD-10-CM | POA: Diagnosis not present

## 2019-11-21 DIAGNOSIS — R1310 Dysphagia, unspecified: Secondary | ICD-10-CM | POA: Diagnosis not present

## 2019-11-21 DIAGNOSIS — R112 Nausea with vomiting, unspecified: Secondary | ICD-10-CM | POA: Diagnosis not present

## 2019-11-22 DIAGNOSIS — I10 Essential (primary) hypertension: Secondary | ICD-10-CM | POA: Diagnosis not present

## 2019-11-22 DIAGNOSIS — R443 Hallucinations, unspecified: Secondary | ICD-10-CM | POA: Diagnosis not present

## 2019-11-22 DIAGNOSIS — R1310 Dysphagia, unspecified: Secondary | ICD-10-CM | POA: Diagnosis not present

## 2019-11-22 DIAGNOSIS — R112 Nausea with vomiting, unspecified: Secondary | ICD-10-CM | POA: Diagnosis not present

## 2019-11-22 DIAGNOSIS — E43 Unspecified severe protein-calorie malnutrition: Secondary | ICD-10-CM | POA: Diagnosis not present

## 2019-11-22 DIAGNOSIS — L89152 Pressure ulcer of sacral region, stage 2: Secondary | ICD-10-CM | POA: Diagnosis not present

## 2019-11-26 DIAGNOSIS — Z9181 History of falling: Secondary | ICD-10-CM | POA: Diagnosis not present

## 2019-11-26 DIAGNOSIS — E039 Hypothyroidism, unspecified: Secondary | ICD-10-CM | POA: Diagnosis not present

## 2019-11-26 DIAGNOSIS — E43 Unspecified severe protein-calorie malnutrition: Secondary | ICD-10-CM | POA: Diagnosis not present

## 2019-11-26 DIAGNOSIS — R443 Hallucinations, unspecified: Secondary | ICD-10-CM | POA: Diagnosis not present

## 2019-11-26 DIAGNOSIS — L89152 Pressure ulcer of sacral region, stage 2: Secondary | ICD-10-CM | POA: Diagnosis not present

## 2019-11-26 DIAGNOSIS — R1310 Dysphagia, unspecified: Secondary | ICD-10-CM | POA: Diagnosis not present

## 2019-11-26 DIAGNOSIS — Z681 Body mass index (BMI) 19 or less, adult: Secondary | ICD-10-CM | POA: Diagnosis not present

## 2019-11-26 DIAGNOSIS — I10 Essential (primary) hypertension: Secondary | ICD-10-CM | POA: Diagnosis not present

## 2019-11-26 DIAGNOSIS — R112 Nausea with vomiting, unspecified: Secondary | ICD-10-CM | POA: Diagnosis not present

## 2019-11-26 DIAGNOSIS — Z741 Need for assistance with personal care: Secondary | ICD-10-CM | POA: Diagnosis not present

## 2019-11-28 DIAGNOSIS — L89152 Pressure ulcer of sacral region, stage 2: Secondary | ICD-10-CM | POA: Diagnosis not present

## 2019-11-28 DIAGNOSIS — R443 Hallucinations, unspecified: Secondary | ICD-10-CM | POA: Diagnosis not present

## 2019-11-28 DIAGNOSIS — I10 Essential (primary) hypertension: Secondary | ICD-10-CM | POA: Diagnosis not present

## 2019-11-28 DIAGNOSIS — E43 Unspecified severe protein-calorie malnutrition: Secondary | ICD-10-CM | POA: Diagnosis not present

## 2019-11-28 DIAGNOSIS — R1310 Dysphagia, unspecified: Secondary | ICD-10-CM | POA: Diagnosis not present

## 2019-11-28 DIAGNOSIS — R112 Nausea with vomiting, unspecified: Secondary | ICD-10-CM | POA: Diagnosis not present

## 2019-11-29 DIAGNOSIS — R112 Nausea with vomiting, unspecified: Secondary | ICD-10-CM | POA: Diagnosis not present

## 2019-11-29 DIAGNOSIS — I10 Essential (primary) hypertension: Secondary | ICD-10-CM | POA: Diagnosis not present

## 2019-11-29 DIAGNOSIS — R443 Hallucinations, unspecified: Secondary | ICD-10-CM | POA: Diagnosis not present

## 2019-11-29 DIAGNOSIS — R1310 Dysphagia, unspecified: Secondary | ICD-10-CM | POA: Diagnosis not present

## 2019-11-29 DIAGNOSIS — E43 Unspecified severe protein-calorie malnutrition: Secondary | ICD-10-CM | POA: Diagnosis not present

## 2019-11-29 DIAGNOSIS — L89152 Pressure ulcer of sacral region, stage 2: Secondary | ICD-10-CM | POA: Diagnosis not present

## 2019-12-03 DIAGNOSIS — I10 Essential (primary) hypertension: Secondary | ICD-10-CM | POA: Diagnosis not present

## 2019-12-03 DIAGNOSIS — L89152 Pressure ulcer of sacral region, stage 2: Secondary | ICD-10-CM | POA: Diagnosis not present

## 2019-12-03 DIAGNOSIS — R1310 Dysphagia, unspecified: Secondary | ICD-10-CM | POA: Diagnosis not present

## 2019-12-03 DIAGNOSIS — R112 Nausea with vomiting, unspecified: Secondary | ICD-10-CM | POA: Diagnosis not present

## 2019-12-03 DIAGNOSIS — R443 Hallucinations, unspecified: Secondary | ICD-10-CM | POA: Diagnosis not present

## 2019-12-03 DIAGNOSIS — E43 Unspecified severe protein-calorie malnutrition: Secondary | ICD-10-CM | POA: Diagnosis not present

## 2019-12-05 DIAGNOSIS — E43 Unspecified severe protein-calorie malnutrition: Secondary | ICD-10-CM | POA: Diagnosis not present

## 2019-12-05 DIAGNOSIS — R1310 Dysphagia, unspecified: Secondary | ICD-10-CM | POA: Diagnosis not present

## 2019-12-05 DIAGNOSIS — R443 Hallucinations, unspecified: Secondary | ICD-10-CM | POA: Diagnosis not present

## 2019-12-05 DIAGNOSIS — R112 Nausea with vomiting, unspecified: Secondary | ICD-10-CM | POA: Diagnosis not present

## 2019-12-05 DIAGNOSIS — I10 Essential (primary) hypertension: Secondary | ICD-10-CM | POA: Diagnosis not present

## 2019-12-05 DIAGNOSIS — L89152 Pressure ulcer of sacral region, stage 2: Secondary | ICD-10-CM | POA: Diagnosis not present

## 2019-12-06 DIAGNOSIS — I10 Essential (primary) hypertension: Secondary | ICD-10-CM | POA: Diagnosis not present

## 2019-12-06 DIAGNOSIS — R112 Nausea with vomiting, unspecified: Secondary | ICD-10-CM | POA: Diagnosis not present

## 2019-12-06 DIAGNOSIS — R1310 Dysphagia, unspecified: Secondary | ICD-10-CM | POA: Diagnosis not present

## 2019-12-06 DIAGNOSIS — R443 Hallucinations, unspecified: Secondary | ICD-10-CM | POA: Diagnosis not present

## 2019-12-06 DIAGNOSIS — L89152 Pressure ulcer of sacral region, stage 2: Secondary | ICD-10-CM | POA: Diagnosis not present

## 2019-12-06 DIAGNOSIS — E43 Unspecified severe protein-calorie malnutrition: Secondary | ICD-10-CM | POA: Diagnosis not present

## 2019-12-10 DIAGNOSIS — R1310 Dysphagia, unspecified: Secondary | ICD-10-CM | POA: Diagnosis not present

## 2019-12-10 DIAGNOSIS — E43 Unspecified severe protein-calorie malnutrition: Secondary | ICD-10-CM | POA: Diagnosis not present

## 2019-12-10 DIAGNOSIS — R443 Hallucinations, unspecified: Secondary | ICD-10-CM | POA: Diagnosis not present

## 2019-12-10 DIAGNOSIS — R112 Nausea with vomiting, unspecified: Secondary | ICD-10-CM | POA: Diagnosis not present

## 2019-12-10 DIAGNOSIS — L89152 Pressure ulcer of sacral region, stage 2: Secondary | ICD-10-CM | POA: Diagnosis not present

## 2019-12-10 DIAGNOSIS — I10 Essential (primary) hypertension: Secondary | ICD-10-CM | POA: Diagnosis not present

## 2019-12-12 DIAGNOSIS — R112 Nausea with vomiting, unspecified: Secondary | ICD-10-CM | POA: Diagnosis not present

## 2019-12-12 DIAGNOSIS — R1310 Dysphagia, unspecified: Secondary | ICD-10-CM | POA: Diagnosis not present

## 2019-12-12 DIAGNOSIS — I10 Essential (primary) hypertension: Secondary | ICD-10-CM | POA: Diagnosis not present

## 2019-12-12 DIAGNOSIS — L89152 Pressure ulcer of sacral region, stage 2: Secondary | ICD-10-CM | POA: Diagnosis not present

## 2019-12-12 DIAGNOSIS — E43 Unspecified severe protein-calorie malnutrition: Secondary | ICD-10-CM | POA: Diagnosis not present

## 2019-12-12 DIAGNOSIS — R443 Hallucinations, unspecified: Secondary | ICD-10-CM | POA: Diagnosis not present

## 2019-12-13 DIAGNOSIS — E43 Unspecified severe protein-calorie malnutrition: Secondary | ICD-10-CM | POA: Diagnosis not present

## 2019-12-13 DIAGNOSIS — R112 Nausea with vomiting, unspecified: Secondary | ICD-10-CM | POA: Diagnosis not present

## 2019-12-13 DIAGNOSIS — R443 Hallucinations, unspecified: Secondary | ICD-10-CM | POA: Diagnosis not present

## 2019-12-13 DIAGNOSIS — I10 Essential (primary) hypertension: Secondary | ICD-10-CM | POA: Diagnosis not present

## 2019-12-13 DIAGNOSIS — L89152 Pressure ulcer of sacral region, stage 2: Secondary | ICD-10-CM | POA: Diagnosis not present

## 2019-12-13 DIAGNOSIS — R1310 Dysphagia, unspecified: Secondary | ICD-10-CM | POA: Diagnosis not present

## 2019-12-17 DIAGNOSIS — R443 Hallucinations, unspecified: Secondary | ICD-10-CM | POA: Diagnosis not present

## 2019-12-17 DIAGNOSIS — R112 Nausea with vomiting, unspecified: Secondary | ICD-10-CM | POA: Diagnosis not present

## 2019-12-17 DIAGNOSIS — I10 Essential (primary) hypertension: Secondary | ICD-10-CM | POA: Diagnosis not present

## 2019-12-17 DIAGNOSIS — E43 Unspecified severe protein-calorie malnutrition: Secondary | ICD-10-CM | POA: Diagnosis not present

## 2019-12-17 DIAGNOSIS — R1310 Dysphagia, unspecified: Secondary | ICD-10-CM | POA: Diagnosis not present

## 2019-12-17 DIAGNOSIS — L89152 Pressure ulcer of sacral region, stage 2: Secondary | ICD-10-CM | POA: Diagnosis not present

## 2019-12-19 DIAGNOSIS — R1310 Dysphagia, unspecified: Secondary | ICD-10-CM | POA: Diagnosis not present

## 2019-12-19 DIAGNOSIS — I10 Essential (primary) hypertension: Secondary | ICD-10-CM | POA: Diagnosis not present

## 2019-12-19 DIAGNOSIS — R443 Hallucinations, unspecified: Secondary | ICD-10-CM | POA: Diagnosis not present

## 2019-12-19 DIAGNOSIS — R112 Nausea with vomiting, unspecified: Secondary | ICD-10-CM | POA: Diagnosis not present

## 2019-12-19 DIAGNOSIS — E43 Unspecified severe protein-calorie malnutrition: Secondary | ICD-10-CM | POA: Diagnosis not present

## 2019-12-19 DIAGNOSIS — L89152 Pressure ulcer of sacral region, stage 2: Secondary | ICD-10-CM | POA: Diagnosis not present

## 2019-12-20 DIAGNOSIS — L89152 Pressure ulcer of sacral region, stage 2: Secondary | ICD-10-CM | POA: Diagnosis not present

## 2019-12-20 DIAGNOSIS — R1310 Dysphagia, unspecified: Secondary | ICD-10-CM | POA: Diagnosis not present

## 2019-12-20 DIAGNOSIS — E43 Unspecified severe protein-calorie malnutrition: Secondary | ICD-10-CM | POA: Diagnosis not present

## 2019-12-20 DIAGNOSIS — I10 Essential (primary) hypertension: Secondary | ICD-10-CM | POA: Diagnosis not present

## 2019-12-20 DIAGNOSIS — R112 Nausea with vomiting, unspecified: Secondary | ICD-10-CM | POA: Diagnosis not present

## 2019-12-20 DIAGNOSIS — R443 Hallucinations, unspecified: Secondary | ICD-10-CM | POA: Diagnosis not present

## 2019-12-24 DIAGNOSIS — R1310 Dysphagia, unspecified: Secondary | ICD-10-CM | POA: Diagnosis not present

## 2019-12-24 DIAGNOSIS — E43 Unspecified severe protein-calorie malnutrition: Secondary | ICD-10-CM | POA: Diagnosis not present

## 2019-12-24 DIAGNOSIS — R443 Hallucinations, unspecified: Secondary | ICD-10-CM | POA: Diagnosis not present

## 2019-12-24 DIAGNOSIS — I10 Essential (primary) hypertension: Secondary | ICD-10-CM | POA: Diagnosis not present

## 2019-12-24 DIAGNOSIS — L89152 Pressure ulcer of sacral region, stage 2: Secondary | ICD-10-CM | POA: Diagnosis not present

## 2019-12-24 DIAGNOSIS — R112 Nausea with vomiting, unspecified: Secondary | ICD-10-CM | POA: Diagnosis not present

## 2019-12-25 DIAGNOSIS — I10 Essential (primary) hypertension: Secondary | ICD-10-CM | POA: Diagnosis not present

## 2019-12-26 DIAGNOSIS — L89152 Pressure ulcer of sacral region, stage 2: Secondary | ICD-10-CM | POA: Diagnosis not present

## 2019-12-26 DIAGNOSIS — E43 Unspecified severe protein-calorie malnutrition: Secondary | ICD-10-CM | POA: Diagnosis not present

## 2019-12-26 DIAGNOSIS — Z9181 History of falling: Secondary | ICD-10-CM | POA: Diagnosis not present

## 2019-12-26 DIAGNOSIS — R1310 Dysphagia, unspecified: Secondary | ICD-10-CM | POA: Diagnosis not present

## 2019-12-26 DIAGNOSIS — R112 Nausea with vomiting, unspecified: Secondary | ICD-10-CM | POA: Diagnosis not present

## 2019-12-26 DIAGNOSIS — E039 Hypothyroidism, unspecified: Secondary | ICD-10-CM | POA: Diagnosis not present

## 2019-12-26 DIAGNOSIS — R443 Hallucinations, unspecified: Secondary | ICD-10-CM | POA: Diagnosis not present

## 2019-12-26 DIAGNOSIS — I10 Essential (primary) hypertension: Secondary | ICD-10-CM | POA: Diagnosis not present

## 2019-12-26 DIAGNOSIS — Z681 Body mass index (BMI) 19 or less, adult: Secondary | ICD-10-CM | POA: Diagnosis not present

## 2019-12-26 DIAGNOSIS — Z741 Need for assistance with personal care: Secondary | ICD-10-CM | POA: Diagnosis not present

## 2019-12-27 DIAGNOSIS — L89152 Pressure ulcer of sacral region, stage 2: Secondary | ICD-10-CM | POA: Diagnosis not present

## 2019-12-27 DIAGNOSIS — R112 Nausea with vomiting, unspecified: Secondary | ICD-10-CM | POA: Diagnosis not present

## 2019-12-27 DIAGNOSIS — R1310 Dysphagia, unspecified: Secondary | ICD-10-CM | POA: Diagnosis not present

## 2019-12-27 DIAGNOSIS — I10 Essential (primary) hypertension: Secondary | ICD-10-CM | POA: Diagnosis not present

## 2019-12-27 DIAGNOSIS — R443 Hallucinations, unspecified: Secondary | ICD-10-CM | POA: Diagnosis not present

## 2019-12-27 DIAGNOSIS — E43 Unspecified severe protein-calorie malnutrition: Secondary | ICD-10-CM | POA: Diagnosis not present

## 2019-12-31 DIAGNOSIS — R112 Nausea with vomiting, unspecified: Secondary | ICD-10-CM | POA: Diagnosis not present

## 2019-12-31 DIAGNOSIS — E43 Unspecified severe protein-calorie malnutrition: Secondary | ICD-10-CM | POA: Diagnosis not present

## 2019-12-31 DIAGNOSIS — I10 Essential (primary) hypertension: Secondary | ICD-10-CM | POA: Diagnosis not present

## 2019-12-31 DIAGNOSIS — L89152 Pressure ulcer of sacral region, stage 2: Secondary | ICD-10-CM | POA: Diagnosis not present

## 2019-12-31 DIAGNOSIS — R443 Hallucinations, unspecified: Secondary | ICD-10-CM | POA: Diagnosis not present

## 2019-12-31 DIAGNOSIS — R1310 Dysphagia, unspecified: Secondary | ICD-10-CM | POA: Diagnosis not present

## 2020-01-02 DIAGNOSIS — R112 Nausea with vomiting, unspecified: Secondary | ICD-10-CM | POA: Diagnosis not present

## 2020-01-02 DIAGNOSIS — E43 Unspecified severe protein-calorie malnutrition: Secondary | ICD-10-CM | POA: Diagnosis not present

## 2020-01-02 DIAGNOSIS — R1310 Dysphagia, unspecified: Secondary | ICD-10-CM | POA: Diagnosis not present

## 2020-01-02 DIAGNOSIS — I10 Essential (primary) hypertension: Secondary | ICD-10-CM | POA: Diagnosis not present

## 2020-01-02 DIAGNOSIS — L89152 Pressure ulcer of sacral region, stage 2: Secondary | ICD-10-CM | POA: Diagnosis not present

## 2020-01-02 DIAGNOSIS — R443 Hallucinations, unspecified: Secondary | ICD-10-CM | POA: Diagnosis not present

## 2020-01-03 DIAGNOSIS — R443 Hallucinations, unspecified: Secondary | ICD-10-CM | POA: Diagnosis not present

## 2020-01-03 DIAGNOSIS — R112 Nausea with vomiting, unspecified: Secondary | ICD-10-CM | POA: Diagnosis not present

## 2020-01-03 DIAGNOSIS — I10 Essential (primary) hypertension: Secondary | ICD-10-CM | POA: Diagnosis not present

## 2020-01-03 DIAGNOSIS — E43 Unspecified severe protein-calorie malnutrition: Secondary | ICD-10-CM | POA: Diagnosis not present

## 2020-01-03 DIAGNOSIS — L89152 Pressure ulcer of sacral region, stage 2: Secondary | ICD-10-CM | POA: Diagnosis not present

## 2020-01-03 DIAGNOSIS — R1310 Dysphagia, unspecified: Secondary | ICD-10-CM | POA: Diagnosis not present

## 2020-01-04 DIAGNOSIS — I10 Essential (primary) hypertension: Secondary | ICD-10-CM | POA: Diagnosis not present

## 2020-01-04 DIAGNOSIS — L89152 Pressure ulcer of sacral region, stage 2: Secondary | ICD-10-CM | POA: Diagnosis not present

## 2020-01-04 DIAGNOSIS — E43 Unspecified severe protein-calorie malnutrition: Secondary | ICD-10-CM | POA: Diagnosis not present

## 2020-01-04 DIAGNOSIS — R1310 Dysphagia, unspecified: Secondary | ICD-10-CM | POA: Diagnosis not present

## 2020-01-04 DIAGNOSIS — R443 Hallucinations, unspecified: Secondary | ICD-10-CM | POA: Diagnosis not present

## 2020-01-04 DIAGNOSIS — R112 Nausea with vomiting, unspecified: Secondary | ICD-10-CM | POA: Diagnosis not present

## 2020-01-07 DIAGNOSIS — L89152 Pressure ulcer of sacral region, stage 2: Secondary | ICD-10-CM | POA: Diagnosis not present

## 2020-01-07 DIAGNOSIS — R443 Hallucinations, unspecified: Secondary | ICD-10-CM | POA: Diagnosis not present

## 2020-01-07 DIAGNOSIS — R1310 Dysphagia, unspecified: Secondary | ICD-10-CM | POA: Diagnosis not present

## 2020-01-07 DIAGNOSIS — I10 Essential (primary) hypertension: Secondary | ICD-10-CM | POA: Diagnosis not present

## 2020-01-07 DIAGNOSIS — R112 Nausea with vomiting, unspecified: Secondary | ICD-10-CM | POA: Diagnosis not present

## 2020-01-07 DIAGNOSIS — E43 Unspecified severe protein-calorie malnutrition: Secondary | ICD-10-CM | POA: Diagnosis not present

## 2020-01-08 DIAGNOSIS — R630 Anorexia: Secondary | ICD-10-CM | POA: Diagnosis not present

## 2020-01-08 DIAGNOSIS — K5900 Constipation, unspecified: Secondary | ICD-10-CM | POA: Diagnosis not present

## 2020-01-08 DIAGNOSIS — Z7401 Bed confinement status: Secondary | ICD-10-CM | POA: Diagnosis not present

## 2020-01-08 DIAGNOSIS — G894 Chronic pain syndrome: Secondary | ICD-10-CM | POA: Diagnosis not present

## 2020-01-08 DIAGNOSIS — I10 Essential (primary) hypertension: Secondary | ICD-10-CM | POA: Diagnosis not present

## 2020-01-09 DIAGNOSIS — R443 Hallucinations, unspecified: Secondary | ICD-10-CM | POA: Diagnosis not present

## 2020-01-09 DIAGNOSIS — R112 Nausea with vomiting, unspecified: Secondary | ICD-10-CM | POA: Diagnosis not present

## 2020-01-09 DIAGNOSIS — I10 Essential (primary) hypertension: Secondary | ICD-10-CM | POA: Diagnosis not present

## 2020-01-09 DIAGNOSIS — R1310 Dysphagia, unspecified: Secondary | ICD-10-CM | POA: Diagnosis not present

## 2020-01-09 DIAGNOSIS — L89152 Pressure ulcer of sacral region, stage 2: Secondary | ICD-10-CM | POA: Diagnosis not present

## 2020-01-09 DIAGNOSIS — E43 Unspecified severe protein-calorie malnutrition: Secondary | ICD-10-CM | POA: Diagnosis not present

## 2020-01-10 DIAGNOSIS — R443 Hallucinations, unspecified: Secondary | ICD-10-CM | POA: Diagnosis not present

## 2020-01-10 DIAGNOSIS — R1310 Dysphagia, unspecified: Secondary | ICD-10-CM | POA: Diagnosis not present

## 2020-01-10 DIAGNOSIS — L89152 Pressure ulcer of sacral region, stage 2: Secondary | ICD-10-CM | POA: Diagnosis not present

## 2020-01-10 DIAGNOSIS — R112 Nausea with vomiting, unspecified: Secondary | ICD-10-CM | POA: Diagnosis not present

## 2020-01-10 DIAGNOSIS — I10 Essential (primary) hypertension: Secondary | ICD-10-CM | POA: Diagnosis not present

## 2020-01-10 DIAGNOSIS — E43 Unspecified severe protein-calorie malnutrition: Secondary | ICD-10-CM | POA: Diagnosis not present

## 2020-01-14 DIAGNOSIS — I10 Essential (primary) hypertension: Secondary | ICD-10-CM | POA: Diagnosis not present

## 2020-01-14 DIAGNOSIS — E43 Unspecified severe protein-calorie malnutrition: Secondary | ICD-10-CM | POA: Diagnosis not present

## 2020-01-14 DIAGNOSIS — L89152 Pressure ulcer of sacral region, stage 2: Secondary | ICD-10-CM | POA: Diagnosis not present

## 2020-01-14 DIAGNOSIS — R1310 Dysphagia, unspecified: Secondary | ICD-10-CM | POA: Diagnosis not present

## 2020-01-14 DIAGNOSIS — R112 Nausea with vomiting, unspecified: Secondary | ICD-10-CM | POA: Diagnosis not present

## 2020-01-14 DIAGNOSIS — R443 Hallucinations, unspecified: Secondary | ICD-10-CM | POA: Diagnosis not present

## 2020-01-15 DIAGNOSIS — E43 Unspecified severe protein-calorie malnutrition: Secondary | ICD-10-CM | POA: Diagnosis not present

## 2020-01-15 DIAGNOSIS — L89152 Pressure ulcer of sacral region, stage 2: Secondary | ICD-10-CM | POA: Diagnosis not present

## 2020-01-15 DIAGNOSIS — I10 Essential (primary) hypertension: Secondary | ICD-10-CM | POA: Diagnosis not present

## 2020-01-15 DIAGNOSIS — R443 Hallucinations, unspecified: Secondary | ICD-10-CM | POA: Diagnosis not present

## 2020-01-15 DIAGNOSIS — R1310 Dysphagia, unspecified: Secondary | ICD-10-CM | POA: Diagnosis not present

## 2020-01-15 DIAGNOSIS — R112 Nausea with vomiting, unspecified: Secondary | ICD-10-CM | POA: Diagnosis not present

## 2020-01-16 DIAGNOSIS — R112 Nausea with vomiting, unspecified: Secondary | ICD-10-CM | POA: Diagnosis not present

## 2020-01-16 DIAGNOSIS — E43 Unspecified severe protein-calorie malnutrition: Secondary | ICD-10-CM | POA: Diagnosis not present

## 2020-01-16 DIAGNOSIS — I10 Essential (primary) hypertension: Secondary | ICD-10-CM | POA: Diagnosis not present

## 2020-01-16 DIAGNOSIS — R1310 Dysphagia, unspecified: Secondary | ICD-10-CM | POA: Diagnosis not present

## 2020-01-16 DIAGNOSIS — L89152 Pressure ulcer of sacral region, stage 2: Secondary | ICD-10-CM | POA: Diagnosis not present

## 2020-01-16 DIAGNOSIS — R443 Hallucinations, unspecified: Secondary | ICD-10-CM | POA: Diagnosis not present

## 2020-01-17 DIAGNOSIS — R443 Hallucinations, unspecified: Secondary | ICD-10-CM | POA: Diagnosis not present

## 2020-01-17 DIAGNOSIS — R112 Nausea with vomiting, unspecified: Secondary | ICD-10-CM | POA: Diagnosis not present

## 2020-01-17 DIAGNOSIS — E43 Unspecified severe protein-calorie malnutrition: Secondary | ICD-10-CM | POA: Diagnosis not present

## 2020-01-17 DIAGNOSIS — R1310 Dysphagia, unspecified: Secondary | ICD-10-CM | POA: Diagnosis not present

## 2020-01-17 DIAGNOSIS — I10 Essential (primary) hypertension: Secondary | ICD-10-CM | POA: Diagnosis not present

## 2020-01-17 DIAGNOSIS — L89152 Pressure ulcer of sacral region, stage 2: Secondary | ICD-10-CM | POA: Diagnosis not present

## 2020-01-18 DIAGNOSIS — R112 Nausea with vomiting, unspecified: Secondary | ICD-10-CM | POA: Diagnosis not present

## 2020-01-18 DIAGNOSIS — L89152 Pressure ulcer of sacral region, stage 2: Secondary | ICD-10-CM | POA: Diagnosis not present

## 2020-01-18 DIAGNOSIS — R443 Hallucinations, unspecified: Secondary | ICD-10-CM | POA: Diagnosis not present

## 2020-01-18 DIAGNOSIS — E43 Unspecified severe protein-calorie malnutrition: Secondary | ICD-10-CM | POA: Diagnosis not present

## 2020-01-18 DIAGNOSIS — R1310 Dysphagia, unspecified: Secondary | ICD-10-CM | POA: Diagnosis not present

## 2020-01-18 DIAGNOSIS — I10 Essential (primary) hypertension: Secondary | ICD-10-CM | POA: Diagnosis not present

## 2020-01-19 DIAGNOSIS — L89152 Pressure ulcer of sacral region, stage 2: Secondary | ICD-10-CM | POA: Diagnosis not present

## 2020-01-19 DIAGNOSIS — R443 Hallucinations, unspecified: Secondary | ICD-10-CM | POA: Diagnosis not present

## 2020-01-19 DIAGNOSIS — I10 Essential (primary) hypertension: Secondary | ICD-10-CM | POA: Diagnosis not present

## 2020-01-19 DIAGNOSIS — R1310 Dysphagia, unspecified: Secondary | ICD-10-CM | POA: Diagnosis not present

## 2020-01-19 DIAGNOSIS — R112 Nausea with vomiting, unspecified: Secondary | ICD-10-CM | POA: Diagnosis not present

## 2020-01-19 DIAGNOSIS — E43 Unspecified severe protein-calorie malnutrition: Secondary | ICD-10-CM | POA: Diagnosis not present

## 2020-01-20 DIAGNOSIS — E43 Unspecified severe protein-calorie malnutrition: Secondary | ICD-10-CM | POA: Diagnosis not present

## 2020-01-20 DIAGNOSIS — L89152 Pressure ulcer of sacral region, stage 2: Secondary | ICD-10-CM | POA: Diagnosis not present

## 2020-01-20 DIAGNOSIS — I10 Essential (primary) hypertension: Secondary | ICD-10-CM | POA: Diagnosis not present

## 2020-01-20 DIAGNOSIS — R1310 Dysphagia, unspecified: Secondary | ICD-10-CM | POA: Diagnosis not present

## 2020-01-20 DIAGNOSIS — R443 Hallucinations, unspecified: Secondary | ICD-10-CM | POA: Diagnosis not present

## 2020-01-20 DIAGNOSIS — R112 Nausea with vomiting, unspecified: Secondary | ICD-10-CM | POA: Diagnosis not present

## 2020-01-21 DIAGNOSIS — L89152 Pressure ulcer of sacral region, stage 2: Secondary | ICD-10-CM | POA: Diagnosis not present

## 2020-01-21 DIAGNOSIS — R1310 Dysphagia, unspecified: Secondary | ICD-10-CM | POA: Diagnosis not present

## 2020-01-21 DIAGNOSIS — R112 Nausea with vomiting, unspecified: Secondary | ICD-10-CM | POA: Diagnosis not present

## 2020-01-21 DIAGNOSIS — R443 Hallucinations, unspecified: Secondary | ICD-10-CM | POA: Diagnosis not present

## 2020-01-21 DIAGNOSIS — I10 Essential (primary) hypertension: Secondary | ICD-10-CM | POA: Diagnosis not present

## 2020-01-21 DIAGNOSIS — E43 Unspecified severe protein-calorie malnutrition: Secondary | ICD-10-CM | POA: Diagnosis not present

## 2020-01-22 DIAGNOSIS — I10 Essential (primary) hypertension: Secondary | ICD-10-CM | POA: Diagnosis not present

## 2020-01-22 DIAGNOSIS — L89152 Pressure ulcer of sacral region, stage 2: Secondary | ICD-10-CM | POA: Diagnosis not present

## 2020-01-22 DIAGNOSIS — R112 Nausea with vomiting, unspecified: Secondary | ICD-10-CM | POA: Diagnosis not present

## 2020-01-22 DIAGNOSIS — R443 Hallucinations, unspecified: Secondary | ICD-10-CM | POA: Diagnosis not present

## 2020-01-22 DIAGNOSIS — R1310 Dysphagia, unspecified: Secondary | ICD-10-CM | POA: Diagnosis not present

## 2020-01-22 DIAGNOSIS — E43 Unspecified severe protein-calorie malnutrition: Secondary | ICD-10-CM | POA: Diagnosis not present

## 2020-01-23 DIAGNOSIS — R1310 Dysphagia, unspecified: Secondary | ICD-10-CM | POA: Diagnosis not present

## 2020-01-23 DIAGNOSIS — L89152 Pressure ulcer of sacral region, stage 2: Secondary | ICD-10-CM | POA: Diagnosis not present

## 2020-01-23 DIAGNOSIS — R443 Hallucinations, unspecified: Secondary | ICD-10-CM | POA: Diagnosis not present

## 2020-01-23 DIAGNOSIS — E43 Unspecified severe protein-calorie malnutrition: Secondary | ICD-10-CM | POA: Diagnosis not present

## 2020-01-23 DIAGNOSIS — I10 Essential (primary) hypertension: Secondary | ICD-10-CM | POA: Diagnosis not present

## 2020-01-23 DIAGNOSIS — R112 Nausea with vomiting, unspecified: Secondary | ICD-10-CM | POA: Diagnosis not present

## 2020-01-24 DIAGNOSIS — E43 Unspecified severe protein-calorie malnutrition: Secondary | ICD-10-CM | POA: Diagnosis not present

## 2020-01-24 DIAGNOSIS — L89152 Pressure ulcer of sacral region, stage 2: Secondary | ICD-10-CM | POA: Diagnosis not present

## 2020-01-24 DIAGNOSIS — R1310 Dysphagia, unspecified: Secondary | ICD-10-CM | POA: Diagnosis not present

## 2020-01-24 DIAGNOSIS — I10 Essential (primary) hypertension: Secondary | ICD-10-CM | POA: Diagnosis not present

## 2020-01-24 DIAGNOSIS — R112 Nausea with vomiting, unspecified: Secondary | ICD-10-CM | POA: Diagnosis not present

## 2020-01-24 DIAGNOSIS — R443 Hallucinations, unspecified: Secondary | ICD-10-CM | POA: Diagnosis not present

## 2020-01-25 DIAGNOSIS — E43 Unspecified severe protein-calorie malnutrition: Secondary | ICD-10-CM | POA: Diagnosis not present

## 2020-01-25 DIAGNOSIS — R112 Nausea with vomiting, unspecified: Secondary | ICD-10-CM | POA: Diagnosis not present

## 2020-01-25 DIAGNOSIS — R443 Hallucinations, unspecified: Secondary | ICD-10-CM | POA: Diagnosis not present

## 2020-01-25 DIAGNOSIS — L89152 Pressure ulcer of sacral region, stage 2: Secondary | ICD-10-CM | POA: Diagnosis not present

## 2020-01-25 DIAGNOSIS — I10 Essential (primary) hypertension: Secondary | ICD-10-CM | POA: Diagnosis not present

## 2020-01-25 DIAGNOSIS — R1310 Dysphagia, unspecified: Secondary | ICD-10-CM | POA: Diagnosis not present

## 2020-03-25 DEATH — deceased

## 2021-02-01 IMAGING — CT CT MAXILLOFACIAL W/O CM
3 of 6 series · 15 of 47 positions shown, 18 images · non-contrast
Comparison: None.

CLINICAL DATA: Posttraumatic headache

EXAM:
CT HEAD WITHOUT CONTRAST
CT MAXILLOFACIAL WITHOUT CONTRAST
CT CERVICAL SPINE WITHOUT CONTRAST
TECHNIQUE: Multidetector CT imaging of the head, cervical spine, and
maxillofacial structures were performed using the standard protocol
without intravenous contrast. Multiplanar CT image reconstructions
of the cervical spine and maxillofacial structures were also
generated.

[Series 1: maxilllofacial 2.0 hr40 3 · axial · 0.35mm/px · z∈[-232,-68]mm · 10 of 96 slices shown, 13 images]
[im 7/96  brain]
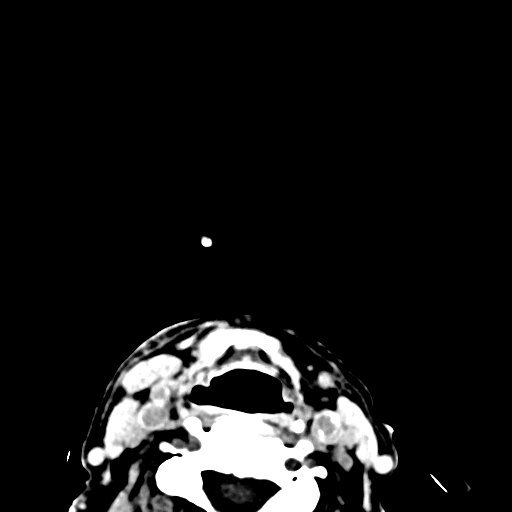
[im 7/96  bone]
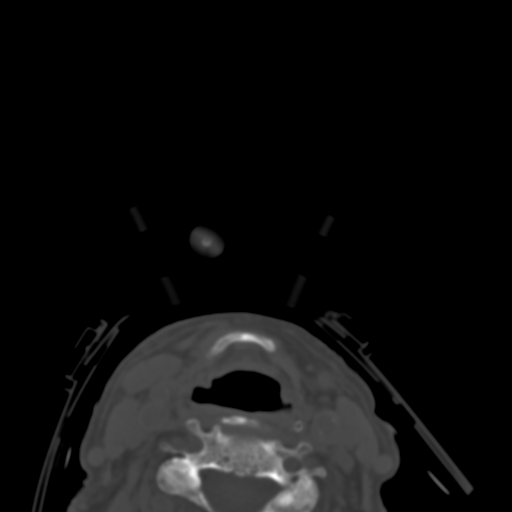
[im 14/96  bone]
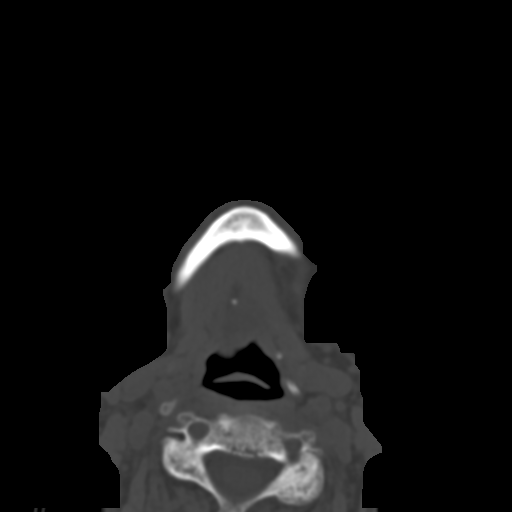
[im 28/96  bone]
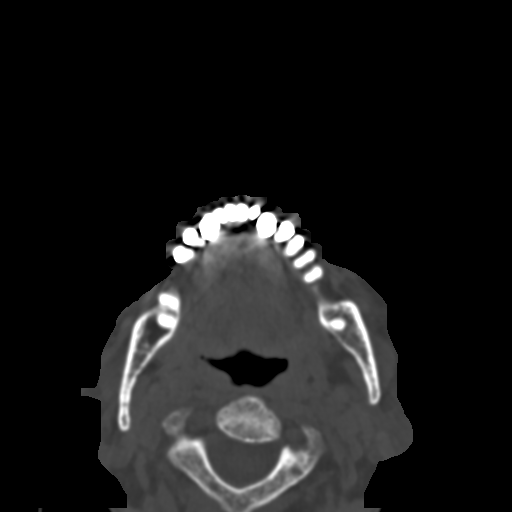
[im 34/96  bone]
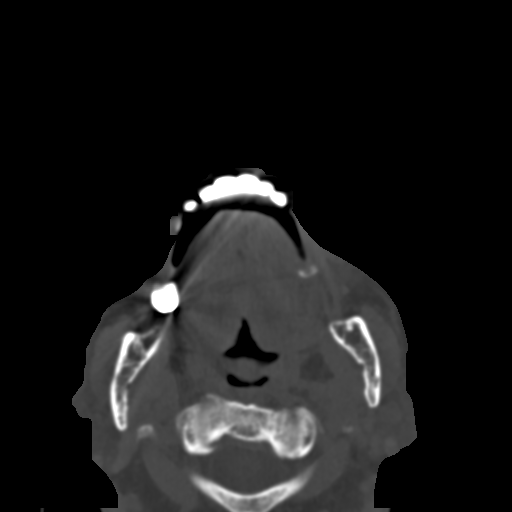
[im 41/96  brain]
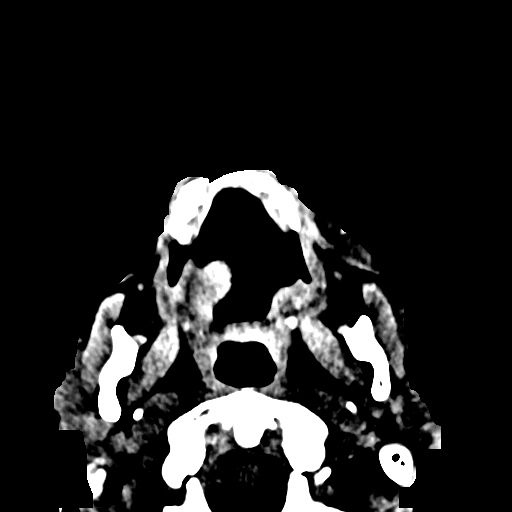
[im 41/96  bone]
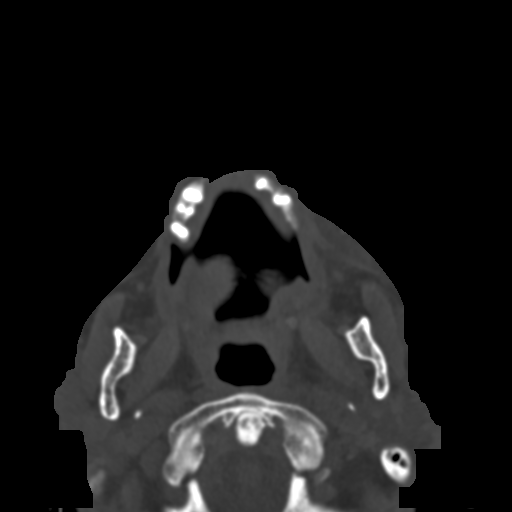
[im 55/96  bone]
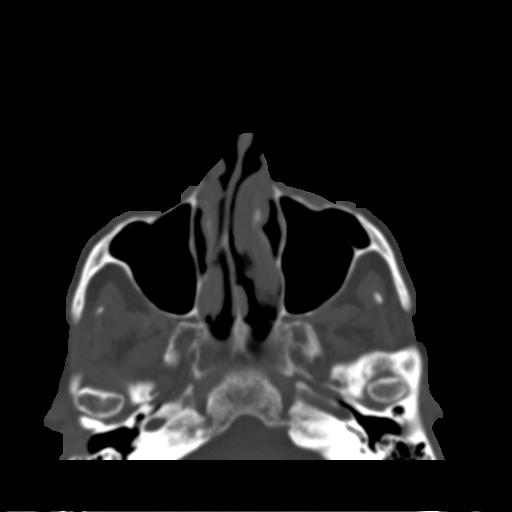
[im 62/96  bone]
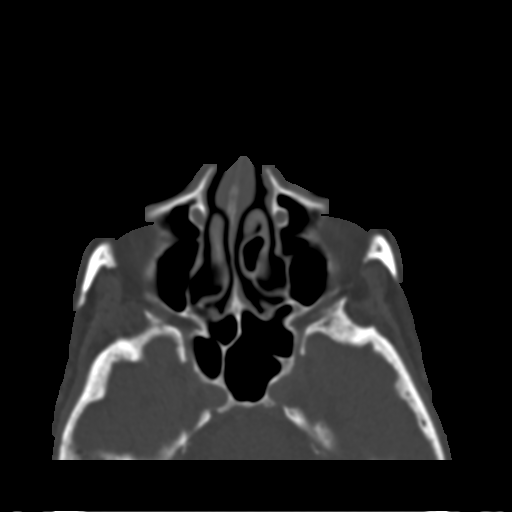
[im 68/96  bone]
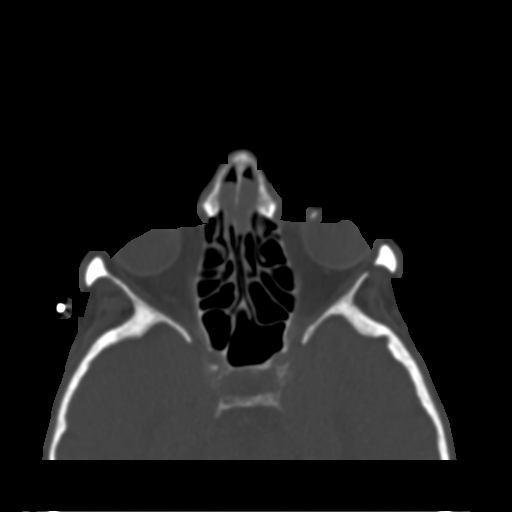
[im 82/96  brain]
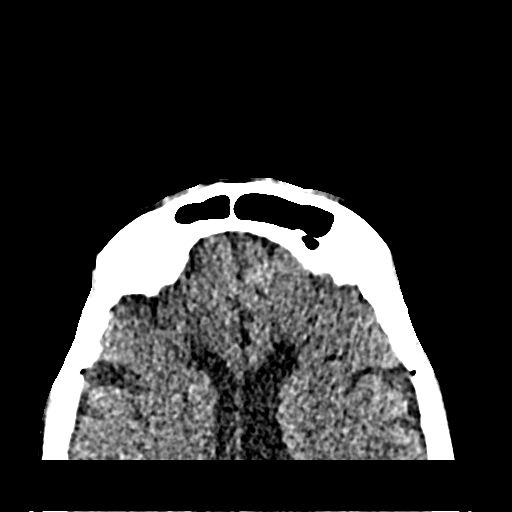
[im 82/96  bone]
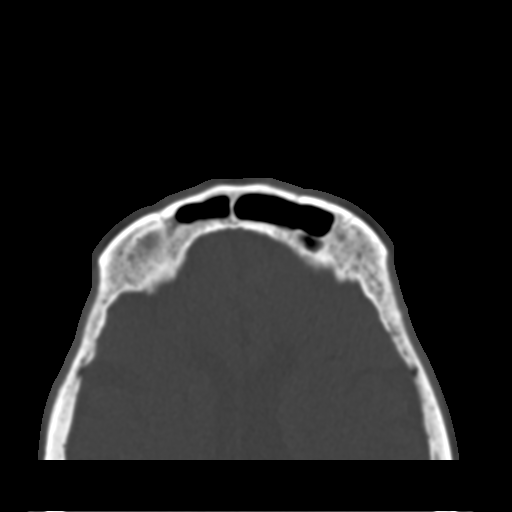
[im 89/96  bone]
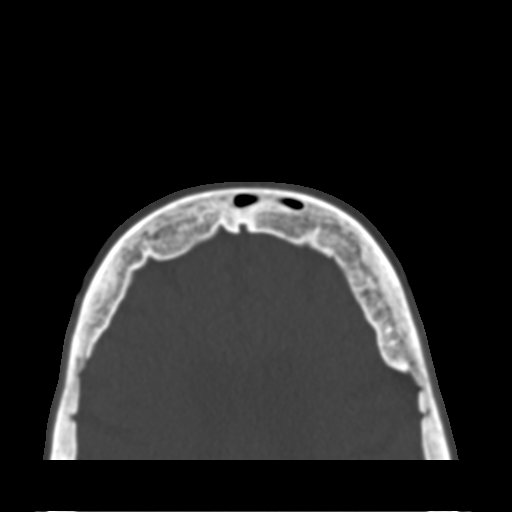

[Series 9: bone cor · coronal · 0.36mm/px · 3 of 67 slices shown]
[im 17/67  bone]
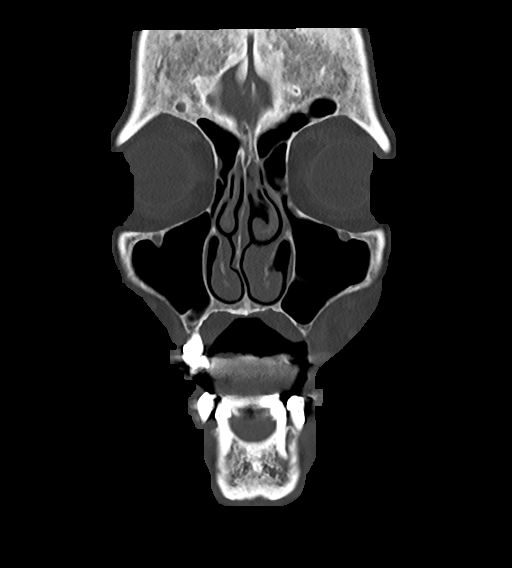
[im 34/67  bone]
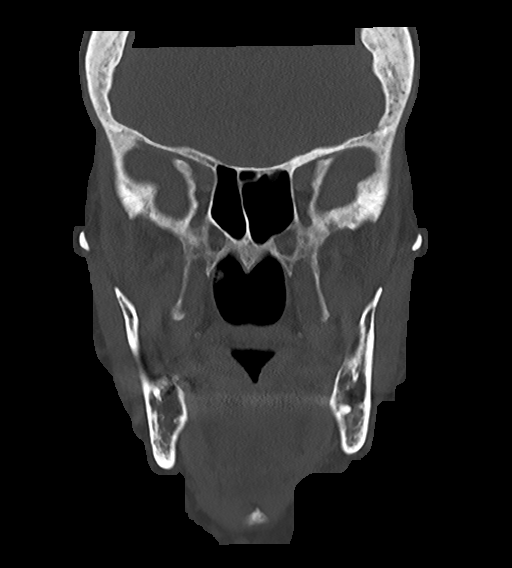
[im 50/67  bone]
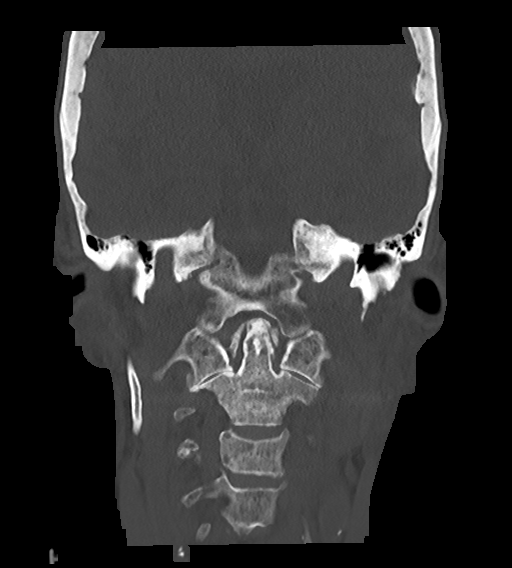

[Series 10: bone sag · sagittal · 0.38mm/px · 2 of 74 slices shown]
[im 25/74  bone]
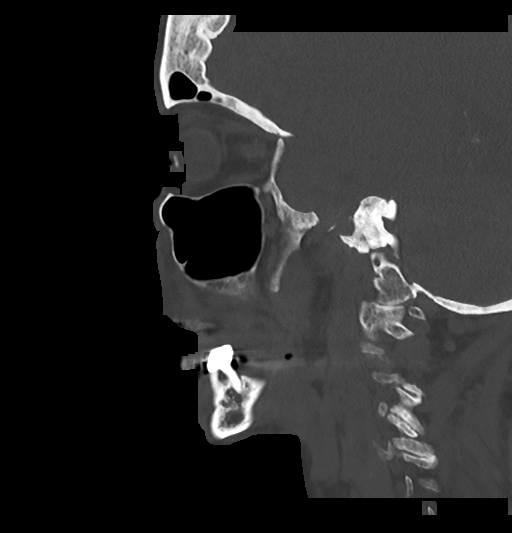
[im 49/74  bone]
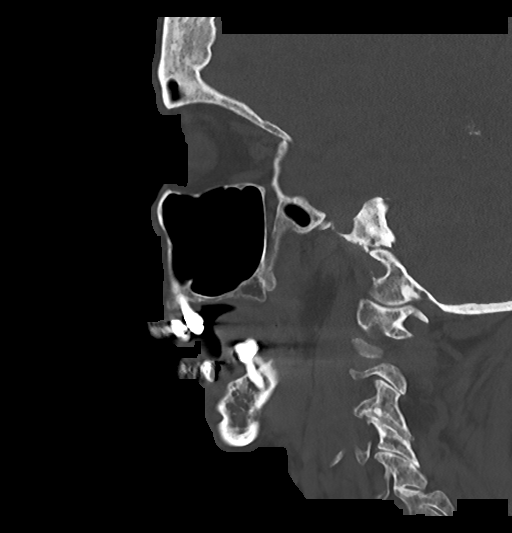

[15 of 47 positions shown; findings below may reference images not displayed]

FINDINGS: CT HEAD FINDINGS

Brain: No evidence of acute infarction, hemorrhage, extra-axial
collection, ventriculomegaly, or mass effect. Generalized cerebral
atrophy. Periventricular white matter low attenuation likely
secondary to microangiopathy.

Vascular: Cerebrovascular atherosclerotic calcifications are noted.

Skull: Negative for fracture or focal lesion.

Sinuses/Orbits: Visualized portions of the orbits are unremarkable.
Visualized portions of the paranasal sinuses are unremarkable.
Visualized portions of the mastoid air cells are unremarkable.

Other: None.

CT MAXILLOFACIAL FINDINGS

Osseous: No fracture or mandibular dislocation. No destructive
process.

Orbits: Negative. No traumatic or inflammatory finding.

Sinuses: Clear.

Soft tissues: No fluid collection or hematoma. Soft tissue contusion
in the left cheek.

CT CERVICAL SPINE FINDINGS

Alignment: Normal.

Skull base and vertebrae: No acute fracture. No primary bone lesion
or focal pathologic process. Generalized osteopenia.

Soft tissues and spinal canal: No prevertebral fluid or swelling. No
visible canal hematoma.

Disc levels: Fusion across the C5-6 and C6-7 vertebral bodies.
Bilateral facet arthropathy of the cervical spine. No foraminal
stenosis. Mild broad-based disc bulge at C3-4.

Upper chest: Lung apices are clear.

Other: No fluid collection or hematoma.
IMPRESSION: 1. No acute intracranial pathology.
2. No acute osseous injury of the maxillofacial bones.
3. No acute osseous injury of the cervical spine.

## 2021-02-01 IMAGING — CT CT CERVICAL SPINE W/O CM
3 of 4 series · 13 of 33 positions shown, 16 images · non-contrast
Comparison: None.

CLINICAL DATA: Posttraumatic headache

EXAM:
CT HEAD WITHOUT CONTRAST
CT MAXILLOFACIAL WITHOUT CONTRAST
CT CERVICAL SPINE WITHOUT CONTRAST
TECHNIQUE: Multidetector CT imaging of the head, cervical spine, and
maxillofacial structures were performed using the standard protocol
without intravenous contrast. Multiplanar CT image reconstructions
of the cervical spine and maxillofacial structures were also
generated.

[Series 8: sag bone · sagittal · 0.32mm/px · 5 of 39 slices shown, 6 images]
[im 13/39  bone]
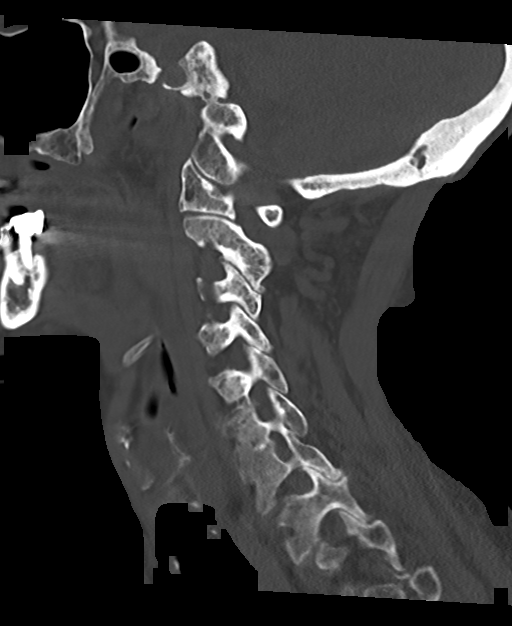
[im 16/39  bone]
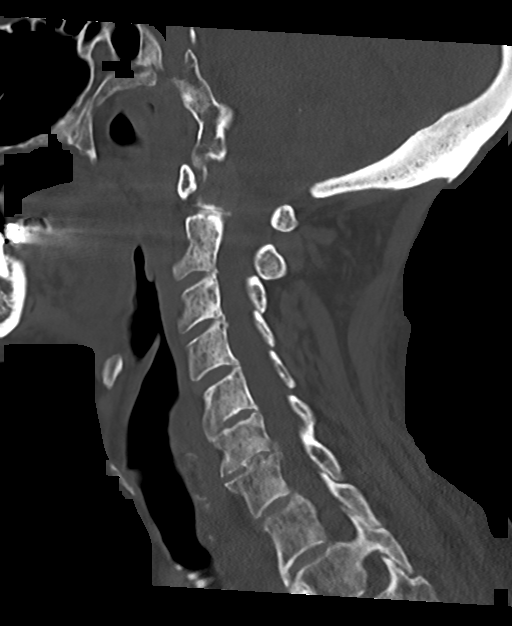
[im 20/39  soft-tissue]
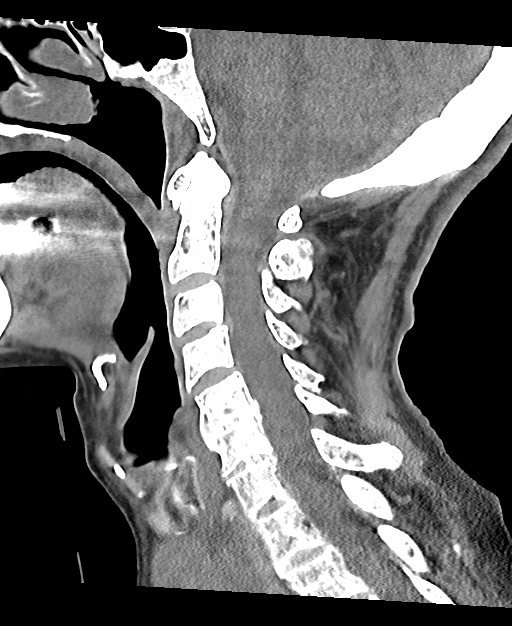
[im 20/39  bone]
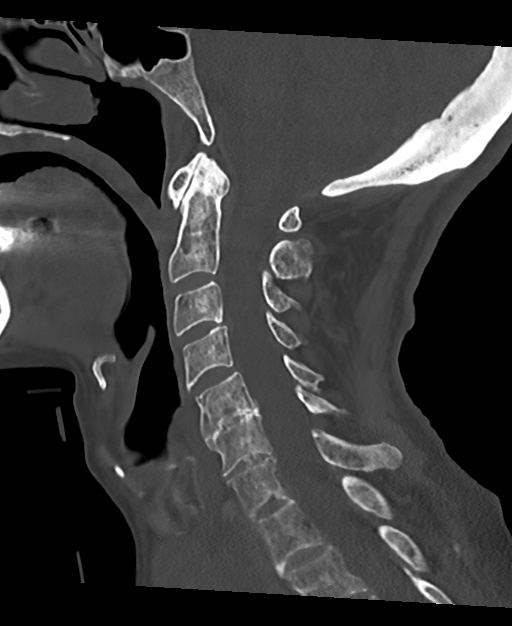
[im 23/39  bone]
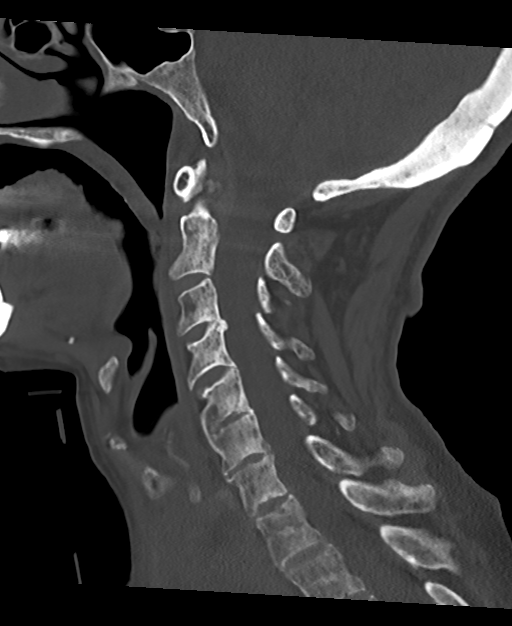
[im 26/39  bone]
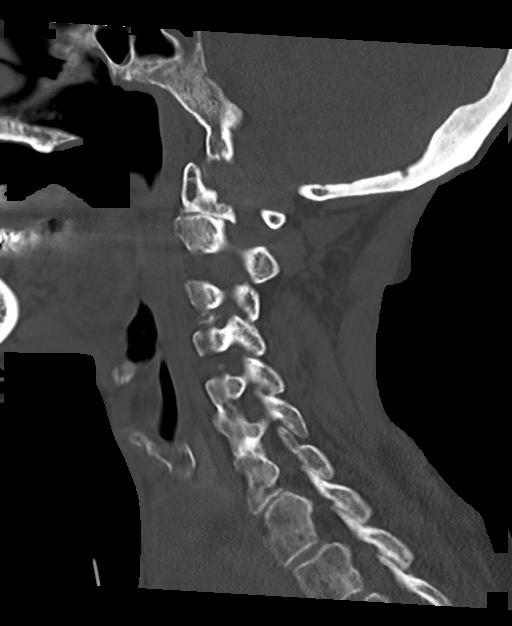

[Series 9: cor bone · coronal · 0.30mm/px · 3 of 62 slices shown]
[im 13/62  bone]
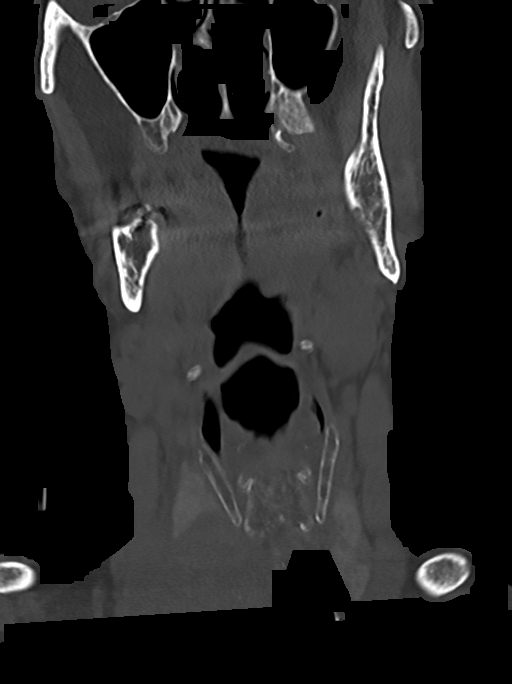
[im 25/62  bone]
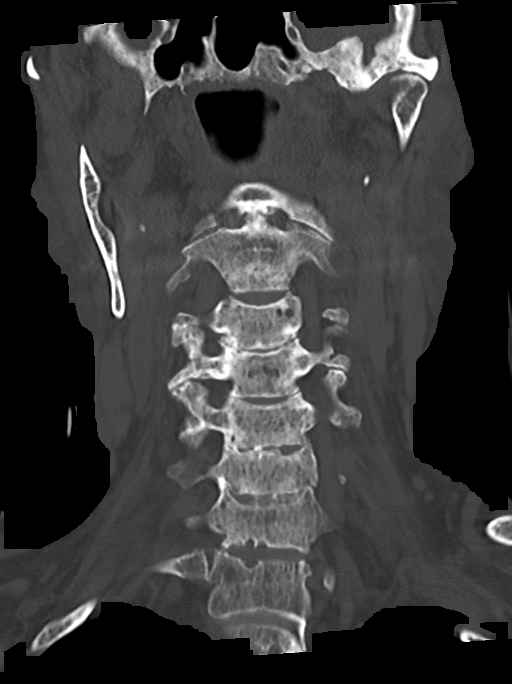
[im 37/62  bone]
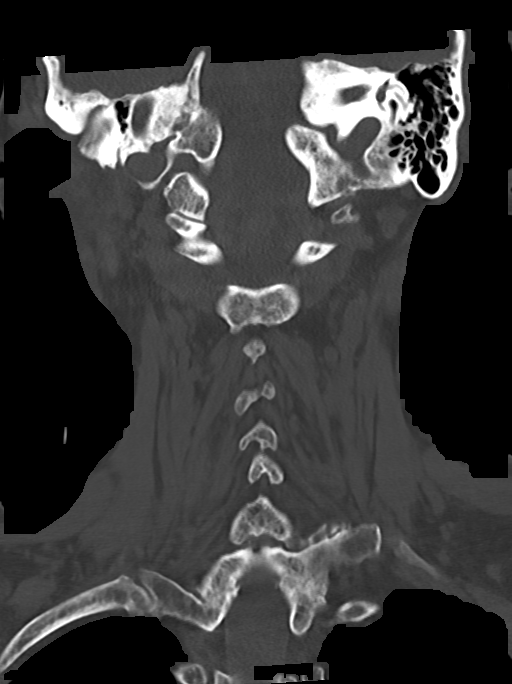

[Series 10: orthogonal axials · axial · 0.21mm/px · z∈[-285,-174]mm · 5 of 86 slices shown, 7 images]
[im 15/86  soft-tissue]
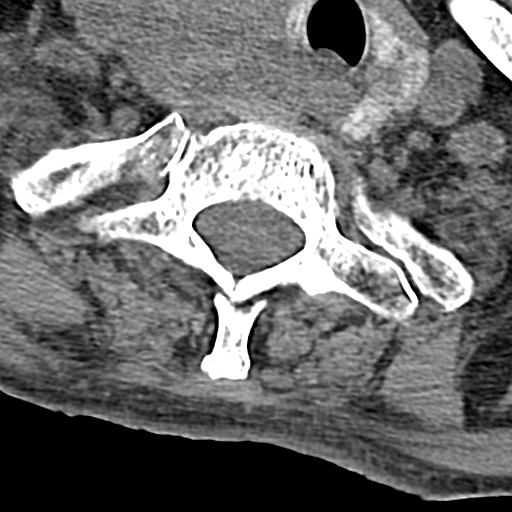
[im 15/86  bone]
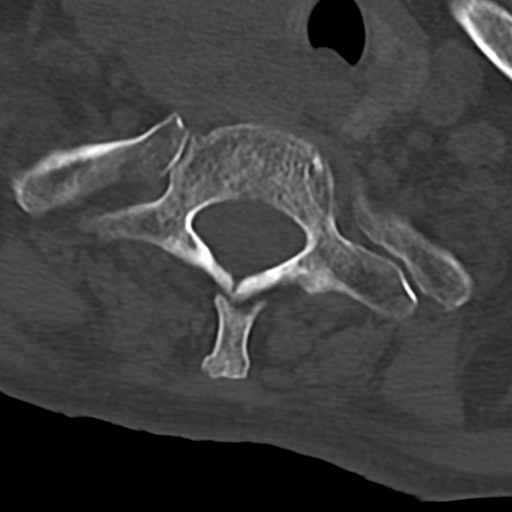
[im 29/86  bone]
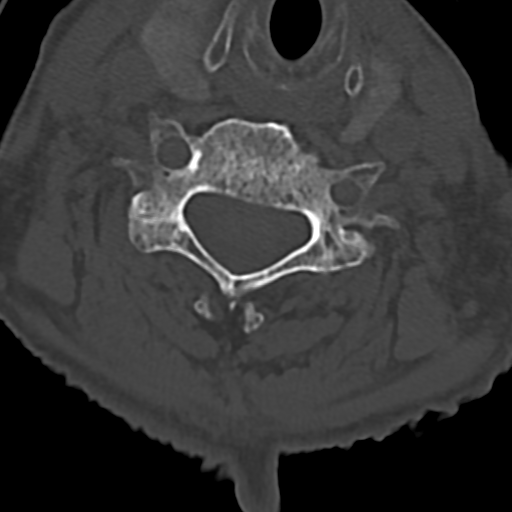
[im 43/86  bone]
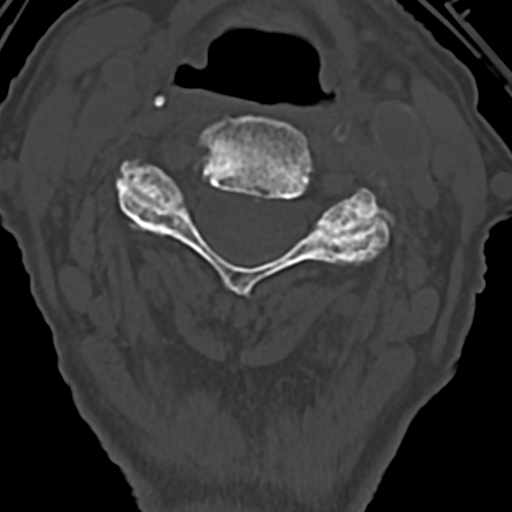
[im 57/86  bone]
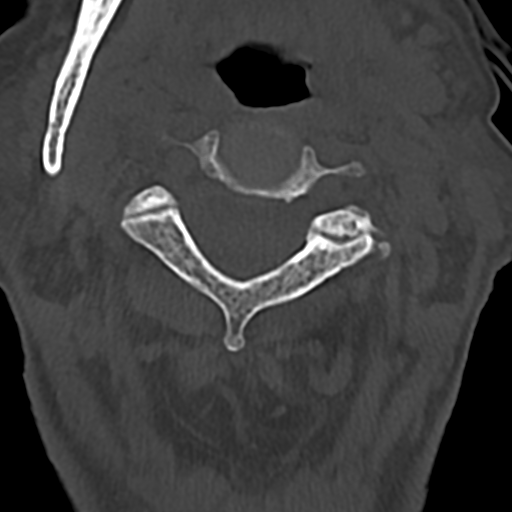
[im 71/86  soft-tissue]
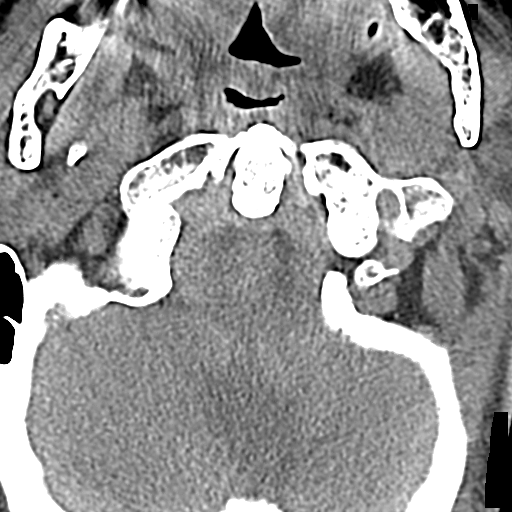
[im 71/86  bone]
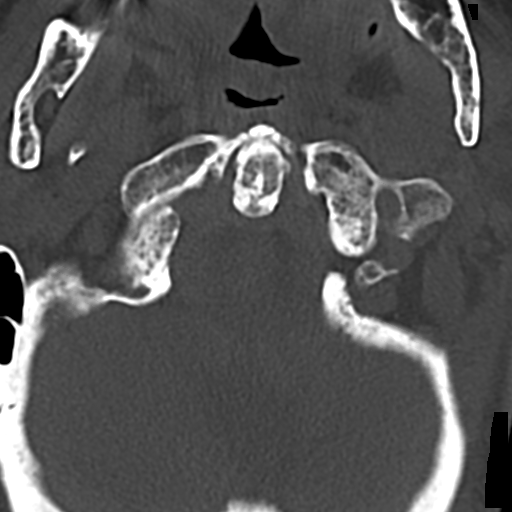

[13 of 33 positions shown; findings below may reference images not displayed]

FINDINGS: CT HEAD FINDINGS

Brain: No evidence of acute infarction, hemorrhage, extra-axial
collection, ventriculomegaly, or mass effect. Generalized cerebral
atrophy. Periventricular white matter low attenuation likely
secondary to microangiopathy.

Vascular: Cerebrovascular atherosclerotic calcifications are noted.

Skull: Negative for fracture or focal lesion.

Sinuses/Orbits: Visualized portions of the orbits are unremarkable.
Visualized portions of the paranasal sinuses are unremarkable.
Visualized portions of the mastoid air cells are unremarkable.

Other: None.

CT MAXILLOFACIAL FINDINGS

Osseous: No fracture or mandibular dislocation. No destructive
process.

Orbits: Negative. No traumatic or inflammatory finding.

Sinuses: Clear.

Soft tissues: No fluid collection or hematoma. Soft tissue contusion
in the left cheek.

CT CERVICAL SPINE FINDINGS

Alignment: Normal.

Skull base and vertebrae: No acute fracture. No primary bone lesion
or focal pathologic process. Generalized osteopenia.

Soft tissues and spinal canal: No prevertebral fluid or swelling. No
visible canal hematoma.

Disc levels: Fusion across the C5-6 and C6-7 vertebral bodies.
Bilateral facet arthropathy of the cervical spine. No foraminal
stenosis. Mild broad-based disc bulge at C3-4.

Upper chest: Lung apices are clear.

Other: No fluid collection or hematoma.
IMPRESSION: 1. No acute intracranial pathology.
2. No acute osseous injury of the maxillofacial bones.
3. No acute osseous injury of the cervical spine.
